# Patient Record
Sex: Male | Born: 1955
Health system: Southern US, Community
[De-identification: ages and names within clinical notes are randomized; demographics above are authoritative.]

## PROBLEM LIST (undated history)

## (undated) DIAGNOSIS — T7840XA Allergy, unspecified, initial encounter: Secondary | ICD-10-CM

## (undated) DIAGNOSIS — I10 Essential (primary) hypertension: Secondary | ICD-10-CM

## (undated) DIAGNOSIS — R011 Cardiac murmur, unspecified: Secondary | ICD-10-CM

## (undated) HISTORY — DX: Cardiac murmur, unspecified: R01.1

## (undated) HISTORY — DX: Essential (primary) hypertension: I10

## (undated) HISTORY — PX: OTHER SURGICAL HISTORY: SHX169

## (undated) HISTORY — DX: Allergy, unspecified, initial encounter: T78.40XA

---

## 2004-09-22 ENCOUNTER — Ambulatory Visit: Payer: Self-pay | Admitting: Family Medicine

## 2004-10-05 ENCOUNTER — Ambulatory Visit: Payer: Self-pay | Admitting: Family Medicine

## 2004-10-18 ENCOUNTER — Ambulatory Visit: Payer: Self-pay | Admitting: Family Medicine

## 2004-10-31 HISTORY — PX: KNEE ARTHROSCOPY WITH ANTERIOR CRUCIATE LIGAMENT (ACL) REPAIR: SHX5644

## 2005-07-19 ENCOUNTER — Ambulatory Visit: Payer: Self-pay | Admitting: Family Medicine

## 2005-08-02 ENCOUNTER — Ambulatory Visit: Payer: Self-pay | Admitting: Family Medicine

## 2005-09-02 ENCOUNTER — Ambulatory Visit: Payer: Self-pay | Admitting: Family Medicine

## 2006-03-23 ENCOUNTER — Ambulatory Visit: Payer: Self-pay | Admitting: Family Medicine

## 2006-03-25 ENCOUNTER — Ambulatory Visit: Payer: Self-pay | Admitting: Family Medicine

## 2006-10-02 ENCOUNTER — Ambulatory Visit: Payer: Self-pay | Admitting: Family Medicine

## 2006-10-02 LAB — CONVERTED CEMR LAB
BUN: 17 mg/dL (ref 6–23)
Basophils Absolute: 0 10*3/uL (ref 0.0–0.1)
Basophils Relative: 0 % (ref 0.0–1.0)
CO2: 30 meq/L (ref 19–32)
Calcium: 9.1 mg/dL (ref 8.4–10.5)
Chloride: 106 meq/L (ref 96–112)
Chol/HDL Ratio, serum: 4
Cholesterol: 200 mg/dL (ref 0–200)
Creatinine, Ser: 1.1 mg/dL (ref 0.4–1.5)
Glomerular Filtration Rate, Af Am: 91 mL/min/{1.73_m2}
Glucose, Bld: 98 mg/dL (ref 70–99)
HDL: 50.4 mg/dL (ref >39.0)
Hemoglobin: 14.3 g/dL (ref 13.0–17.0)
LDL Cholesterol: 136 mg/dL — ABNORMAL HIGH (ref 0–99)
Lymphocytes Relative: 47.5 % — ABNORMAL HIGH (ref 12.0–46.0)
MCV: 92.5 fL (ref 78.0–100.0)
Monocytes Absolute: 0.3 10*3/uL (ref 0.2–0.7)
Monocytes Relative: 7.5 % (ref 3.0–11.0)
Neutrophils Relative %: 43.3 % (ref 43.0–77.0)
Platelets: 224 10*3/uL (ref 150–400)
Potassium: 4.5 meq/L (ref 3.5–5.1)
TSH: 2.3 microintl units/mL (ref 0.35–5.50)
Total Protein: 6.6 g/dL (ref 6.0–8.3)
Triglyceride fasting, serum: 68 mg/dL (ref 0–149)

## 2006-10-10 ENCOUNTER — Ambulatory Visit: Payer: Self-pay | Admitting: Family Medicine

## 2006-10-31 HISTORY — PX: COLONOSCOPY: SHX174

## 2006-12-12 ENCOUNTER — Ambulatory Visit: Payer: Self-pay | Admitting: Family Medicine

## 2007-03-09 ENCOUNTER — Ambulatory Visit: Payer: Self-pay | Admitting: Gastroenterology

## 2007-03-23 ENCOUNTER — Ambulatory Visit: Payer: Self-pay | Admitting: Gastroenterology

## 2007-05-31 DIAGNOSIS — T6391XA Toxic effect of contact with unspecified venomous animal, accidental (unintentional), initial encounter: Secondary | ICD-10-CM | POA: Insufficient documentation

## 2007-06-01 ENCOUNTER — Ambulatory Visit: Payer: Self-pay | Admitting: Family Medicine

## 2008-08-22 ENCOUNTER — Ambulatory Visit: Payer: Self-pay | Admitting: Family Medicine

## 2008-08-22 LAB — CONVERTED CEMR LAB
ALT: 30 units/L (ref 0–53)
AST: 31 units/L (ref 0–37)
Albumin: 4 g/dL (ref 3.5–5.2)
Alkaline Phosphatase: 76 units/L (ref 39–117)
BUN: 17 mg/dL (ref 6–23)
Basophils Absolute: 0 10*3/uL (ref 0.0–0.1)
Basophils Relative: 0.4 % (ref 0.0–3.0)
Bilirubin Urine: NEGATIVE
Bilirubin, Direct: 0.1 mg/dL (ref 0.0–0.3)
Blood in Urine, dipstick: NEGATIVE
CO2: 32 meq/L (ref 19–32)
Calcium: 9.2 mg/dL (ref 8.4–10.5)
Chloride: 106 meq/L (ref 96–112)
Cholesterol: 212 mg/dL (ref 0–200)
Creatinine, Ser: 1 mg/dL (ref 0.4–1.5)
Direct LDL: 130.7 mg/dL
Eosinophils Absolute: 0.1 10*3/uL (ref 0.0–0.7)
Eosinophils Relative: 2.3 % (ref 0.0–5.0)
GFR calc Af Amer: 101 mL/min
GFR calc non Af Amer: 83 mL/min
Glucose, Bld: 100 mg/dL — ABNORMAL HIGH (ref 70–99)
Glucose, Urine, Semiquant: NEGATIVE
HCT: 41.8 % (ref 39.0–52.0)
HDL: 50.8 mg/dL (ref >39.0)
Hemoglobin: 14.1 g/dL (ref 13.0–17.0)
Lymphocytes Relative: 43.4 % (ref 12.0–46.0)
MCHC: 33.7 g/dL (ref 30.0–36.0)
MCV: 93.3 fL (ref 78.0–100.0)
Monocytes Absolute: 0.4 10*3/uL (ref 0.1–1.0)
Monocytes Relative: 6.9 % (ref 3.0–12.0)
Neutro Abs: 2.4 10*3/uL (ref 1.4–7.7)
Neutrophils Relative %: 47 % (ref 43.0–77.0)
PSA: 0.41 ng/mL (ref 0.10–4.00)
Platelets: 198 10*3/uL (ref 150–400)
Potassium: 4.9 meq/L (ref 3.5–5.1)
Protein, U semiquant: NEGATIVE
RBC: 4.48 M/uL (ref 4.22–5.81)
RDW: 11.3 % — ABNORMAL LOW (ref 11.5–14.6)
Sodium: 143 meq/L (ref 135–145)
TSH: 1.29 microintl units/mL (ref 0.35–5.50)
Total Bilirubin: 0.8 mg/dL (ref 0.3–1.2)
Total CHOL/HDL Ratio: 4.2
Total Protein: 6.9 g/dL (ref 6.0–8.3)
Triglycerides: 72 mg/dL (ref 0–149)
Urobilinogen, UA: 0.2
VLDL: 14 mg/dL (ref 0–40)
WBC: 5.1 10*3/uL (ref 4.5–10.5)
pH: 8

## 2008-08-29 ENCOUNTER — Ambulatory Visit: Payer: Self-pay | Admitting: Family Medicine

## 2009-07-21 ENCOUNTER — Ambulatory Visit: Payer: Self-pay | Admitting: Family Medicine

## 2009-07-21 ENCOUNTER — Encounter: Payer: Self-pay | Admitting: Family Medicine

## 2009-07-21 DIAGNOSIS — L301 Dyshidrosis [pompholyx]: Secondary | ICD-10-CM

## 2009-07-23 DIAGNOSIS — D239 Other benign neoplasm of skin, unspecified: Secondary | ICD-10-CM | POA: Insufficient documentation

## 2009-12-24 ENCOUNTER — Ambulatory Visit: Payer: Self-pay | Admitting: Internal Medicine

## 2009-12-24 DIAGNOSIS — K120 Recurrent oral aphthae: Secondary | ICD-10-CM | POA: Insufficient documentation

## 2009-12-25 ENCOUNTER — Ambulatory Visit: Payer: Self-pay | Admitting: Family Medicine

## 2009-12-25 LAB — CONVERTED CEMR LAB
Alkaline Phosphatase: 80 units/L (ref 39–117)
Basophils Absolute: 0 10*3/uL (ref 0.0–0.1)
Bilirubin Urine: NEGATIVE
Bilirubin, Direct: 0 mg/dL (ref 0.0–0.3)
Blood in Urine, dipstick: NEGATIVE
CO2: 31 meq/L (ref 19–32)
Calcium: 9.3 mg/dL (ref 8.4–10.5)
Cholesterol: 202 mg/dL — ABNORMAL HIGH (ref 0–200)
Creatinine, Ser: 0.9 mg/dL (ref 0.4–1.5)
Direct LDL: 135.8 mg/dL
Eosinophils Absolute: 0.1 10*3/uL (ref 0.0–0.7)
Glucose, Bld: 97 mg/dL (ref 70–99)
HDL: 56.8 mg/dL (ref >39.00)
Ketones, urine, test strip: NEGATIVE
Lymphocytes Relative: 42.2 % (ref 12.0–46.0)
MCHC: 33.2 g/dL (ref 30.0–36.0)
Neutrophils Relative %: 45.7 % (ref 43.0–77.0)
RDW: 11.4 % — ABNORMAL LOW (ref 11.5–14.6)
Total Bilirubin: 0.5 mg/dL (ref 0.3–1.2)
Total CHOL/HDL Ratio: 4
Triglycerides: 103 mg/dL (ref 0.0–149.0)
Urobilinogen, UA: 0.2

## 2010-01-01 ENCOUNTER — Ambulatory Visit: Payer: Self-pay | Admitting: Family Medicine

## 2010-11-30 NOTE — Assessment & Plan Note (Signed)
Summary: CPX/RCD   Vital Signs:  Patient profile:   55 year old male Height:      71.75 inches Weight:      184 pounds Temp:     97.2 degrees F oral BP sitting:   160 / 90  (left arm) Cuff size:   regular  Vitals Entered By: Kern Reap CMA Duncan Dull) (January 01, 2010 10:27 AM)  Reason for Visit cpx  History of Present Illness: Daniel Harding is a 55 year old single male, nonsmoker, who comes in today for physical evaluation.  He's always been in excellent, health.  He's had no chronic health problems.  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 p.r.n.  He also takes an 81-mg baby aspirin daily.  Health maintenance.  Activities, he gets routine eye care.  Dental care.  Colonoscopy in GI 2008 normal tetanus 2004.  BP at home, normal BP today 160/90.  Review of systems negative except he would like a complete skin exam because he has a lot of freckles and he gets a lot of sunlight.  He plays a lot of golf  Allergies: No Known Drug Allergies  Past History:  Past medical, surgical, family and social histories (including risk factors) reviewed, and no changes noted (except as noted below).  Past Medical History: Reviewed history from 08/29/2008 and no changes required. Unremarkable  Past Surgical History: Reviewed history from 08/29/2008 and no changes required. septal deviation  Family History: Reviewed history from 08/29/2008 and no changes required.  father has prostate cancermother is a history of glaucoma and hypertension no brothers two sisters, one with glaucoma  Social History: Reviewed history from 08/29/2008 and no changes required. Occupation: Never Smoked Alcohol use-no Drug use-no Regular exercise-yes Divorced  Review of Systems      See HPI  Physical Exam  General:  Well-developed,well-nourished,in no acute distress; alert,appropriate and cooperative throughout examination Head:  Normocephalic and atraumatic  without obvious abnormalities. No apparent alopecia or balding. Eyes:  No corneal or conjunctival inflammation noted. EOMI. Perrla. Funduscopic exam benign, without hemorrhages, exudates or papilledema. Vision grossly normal. Ears:  External ear exam shows no significant lesions or deformities.  Otoscopic examination reveals clear canals, tympanic membranes are intact bilaterally without bulging, retraction, inflammation or discharge. Hearing is grossly normal bilaterally. Nose:  External nasal examination shows no deformity or inflammation. Nasal mucosa are pink and moist without lesions or exudates. Mouth:  Oral mucosa and oropharynx without lesions or exudates.  Teeth in good repair. Neck:  No deformities, masses, or tenderness noted. Chest Wall:  No deformities, masses, tenderness or gynecomastia noted. Breasts:  No masses or gynecomastia noted Lungs:  Normal respiratory effort, chest expands symmetrically. Lungs are clear to auscultation, no crackles or wheezes. Heart:  Normal rate and regular rhythm. S1 and S2 normal without gallop, murmur, click, rub or other extra sounds. Abdomen:  Bowel sounds positive,abdomen soft and non-tender without masses, organomegaly or hernias noted. Rectal:  No external abnormalities noted. Normal sphincter tone. No rectal masses or tenderness. Genitalia:  Testes bilaterally descended without nodularity, tenderness or masses. No scrotal masses or lesions. No penis lesions or urethral discharge. Prostate:  Prostate gland firm and smooth, no enlargement, nodularity, tenderness, mass, asymmetry or induration. Msk:  No deformity or scoliosis noted of thoracic or lumbar spine.   Pulses:  R and L carotid,radial,femoral,dorsalis pedis and posterior tibial pulses are full and equal bilaterally Extremities:  No clubbing, cyanosis, edema, or deformity noted with normal  full range of motion of all joints.   Neurologic:  No cranial nerve deficits noted. Station and gait are  normal. Plantar reflexes are down-going bilaterally. DTRs are symmetrical throughout. Sensory, motor and coordinative functions appear intact.   Impression & Recommendations:  Problem # 1:  DYSHIDROTIC ECZEMA (ICD-705.81) Assessment Improved  Orders: Prescription Created Electronically [id number])  Problem # 2:  HEALTH MAINTENANCE EXAM (ICD-V70.0) Assessment: Unchanged  Orders: Prescription Created Electronically [id number]) EKG w/ Interpretation (93000)  Complete Medication List: 1)  Aspirin 81 Mg Tbec (Aspirin) .... Once daily 2)  Multivitamins Tabs (Multiple vitamin) .... Once daily 3)  Amcinonide 0.1 % Oint (Amcinonide) .... Apply two times a day  Patient Instructions: 1)  Please schedule a follow-up appointment in 1 year. 2)  It is important that you exercise regularly at least 20 minutes 5 times a week. If you develop chest pain, have severe difficulty breathing, or feel very tired , stop exercising immediately and seek medical attention. 3)  Take an Aspirin every day. 4)  be sure to wear a hat, and SPS 50+ sunscreen and play   more golf..................also in your your time with your ICU nurse friend.............. nurses are awesome!!!!!!!!!!!!!!! Prescriptions: AMCINONIDE 0.1 % OINT (AMCINONIDE) apply two times a day  #60 gr. x 3   Entered by:   Kern Reap CMA (AAMA)   Authorized by:   Roderick Pee MD   Signed by:   Kern Reap CMA (AAMA) on 01/01/2010   Method used:   Electronically to        [facility]  [place] [address] (retail)       [address]       [place], [place]  [address]       Ph: [phone] or [phone]       Fax: [phone]   RxID:   [id number]

## 2010-11-30 NOTE — Assessment & Plan Note (Signed)
Summary: sores on lips/njr   Vital Signs:  Patient profile:   55 year old male Weight:      190 pounds Temp:     97.6 degrees F oral BP sitting:   128 / 80  (left arm)  Vitals Entered By: Duard Brady LPN (December 24, 2009 2:22 PM) CC: c/o sores on lips x1wk - lymph nodes swollen , no fevers , no hx of fever blisters Is Patient Diabetic? No   CC:  c/o sores on lips x1wk - lymph nodes swollen , no fevers , and no hx of fever blisters.  History of Present Illness: 55 year old patient who has enjoyed excellent health.  For the past 7 days.  He has experienced painful small ulcers on his right lower lip associated with some mild cervical adenopathy.  Malaise, and a sense of a low grade fever.  Ulcers are much improved.  He has had no prior episodes of oral or genital ulcers.  No pertinent exposure history.  Preventive Screening-Counseling & Management  Alcohol-Tobacco     Smoking Status: quit  Allergies (verified): No Known Drug Allergies  Past History:  Past Medical History: Reviewed history from 08/29/2008 and no changes required. Unremarkable  Social History: Smoking Status:  quit  Review of Systems       The patient complains of suspicious skin lesions.  The patient denies anorexia, fever, weight loss, weight gain, vision loss, decreased hearing, hoarseness, chest pain, syncope, dyspnea on exertion, peripheral edema, prolonged cough, headaches, hemoptysis, abdominal pain, melena, hematochezia, hematuria, incontinence, genital sores, muscle weakness, transient blindness, difficulty walking, depression, unusual weight change, abnormal bleeding, enlarged lymph nodes, angioedema, breast masses, and testicular masses.    Physical Exam  General:  Well-developed,well-nourished,in no acute distress; alert,appropriate and cooperative throughout examination Mouth:  Oral mucosa and oropharynx without lesions or exudates.  Teeth in good repair. 3 small two to 3-mm crusted  lesions noted involving the right lower lip Neck:  mild tender adenopathy.  Anterior cervical region Lungs:  Normal respiratory effort, chest expands symmetrically. Lungs are clear to auscultation, no crackles or wheezes. Heart:  Normal rate and regular rhythm. S1 and S2 normal without gallop, murmur, click, rub or other extra sounds.   Impression & Recommendations:  Problem # 1:  APHTHOUS ULCERS (ICD-528.2) local skin care discussed  Complete Medication List: 1)  Aspirin 81 Mg Tbec (Aspirin) .... Once daily 2)  Multivitamins Tabs (Multiple vitamin) .... Once daily 3)  Amcinonide 0.1 % Oint (Amcinonide) .... Apply two times a day  Patient Instructions: 1)  Please schedule a follow-up appointment as needed. 2)  annual exam as scheduled

## 2011-03-17 ENCOUNTER — Other Ambulatory Visit (INDEPENDENT_AMBULATORY_CARE_PROVIDER_SITE_OTHER): Payer: BC Managed Care – PPO

## 2011-03-17 DIAGNOSIS — Z0000 Encounter for general adult medical examination without abnormal findings: Secondary | ICD-10-CM

## 2011-03-17 LAB — LIPID PANEL
HDL: 50.1 mg/dL (ref >39.00)
Total CHOL/HDL Ratio: 4
VLDL: 14.4 mg/dL (ref 0.0–40.0)

## 2011-03-17 LAB — BASIC METABOLIC PANEL
BUN: 20 mg/dL (ref 6–23)
Chloride: 106 mEq/L (ref 96–112)
Creatinine, Ser: 1 mg/dL (ref 0.4–1.5)
GFR: 86 mL/min (ref >60.00)
Glucose, Bld: 83 mg/dL (ref 70–99)
Sodium: 142 mEq/L (ref 135–145)

## 2011-03-17 LAB — CBC WITH DIFFERENTIAL/PLATELET
Basophils Absolute: 0 10*3/uL (ref 0.0–0.1)
Eosinophils Relative: 3.4 % (ref 0.0–5.0)
HCT: 40.9 % (ref 39.0–52.0)
Lymphs Abs: 2.4 10*3/uL (ref 0.7–4.0)
Monocytes Absolute: 0.3 10*3/uL (ref 0.1–1.0)
Monocytes Relative: 6.5 % (ref 3.0–12.0)
Neutrophils Relative %: 45.3 % (ref 43.0–77.0)
Platelets: 220 10*3/uL (ref 150.0–400.0)
RDW: 12.8 % (ref 11.5–14.6)
WBC: 5.3 10*3/uL (ref 4.5–10.5)

## 2011-03-17 LAB — HEPATIC FUNCTION PANEL
AST: 25 U/L (ref 0–37)
Bilirubin, Direct: 0 mg/dL (ref 0.0–0.3)
Total Bilirubin: 0.5 mg/dL (ref 0.3–1.2)

## 2011-03-17 LAB — POCT URINALYSIS DIPSTICK
Leukocytes, UA: NEGATIVE
Nitrite, UA: NEGATIVE
Protein, UA: NEGATIVE
Urobilinogen, UA: 0.2
pH, UA: 7

## 2011-03-24 ENCOUNTER — Encounter: Payer: Self-pay | Admitting: Family Medicine

## 2011-03-24 ENCOUNTER — Ambulatory Visit (INDEPENDENT_AMBULATORY_CARE_PROVIDER_SITE_OTHER): Payer: BC Managed Care – PPO | Admitting: Family Medicine

## 2011-03-24 DIAGNOSIS — D239 Other benign neoplasm of skin, unspecified: Secondary | ICD-10-CM

## 2011-03-24 DIAGNOSIS — L309 Dermatitis, unspecified: Secondary | ICD-10-CM

## 2011-03-24 DIAGNOSIS — L259 Unspecified contact dermatitis, unspecified cause: Secondary | ICD-10-CM

## 2011-03-24 MED ORDER — DESOXIMETASONE 0.05 % EX GEL: 1.0000 g | Freq: Two times a day (BID) | CUTANEOUS | Status: DC

## 2011-03-24 NOTE — Patient Instructions (Signed)
Return sometime in the next couple weeks for removal of the lesion on the left shoulder.  Apply small amount the steroid gel twice daily to the rash.  Return for yearly exam sooner if any problems

## 2011-03-24 NOTE — Progress Notes (Signed)
  Subjective:    Patient ID: Daniel Harding, male    DOB: May 13, 1956, 55 y.o.   MRN: [id number]  HPIBob is a 55 year old male, nonsmoker, who comes in today for general physical examination  Its always been in excellent health.  Has had no chronic health problems and he takes no medication on a regular basis except for an 81-mg baby aspirin daily.  Review of systems otherwise negative except for the last 8 months.  He said sleep dysfunction.  He describes it as the inability to maintain sleep.  He goes to bed to sleep right away, but wakes up at 3 o'clock in the morning.  Can't go back to sleep.  He's tried over-the-counter Benadryl and that seems to help.  His caffeine consumption is minimal.  We discussed for his options.  He is content to continue the Benadryl.  I recommend yearly eye exams because this mother and sister have had a history of glaucoma.  Referred to Dr. Vonna Kotyk.  He gets regular dental care, colonoscopy, normal, tetanus, 2004,    Review of Systems  Psychiatric/Behavioral: Positive for sleep disturbance.       Objective:   Physical Exam  Constitutional: He is oriented to person, place, and time. He appears well-developed and well-nourished.  HENT:  Head: Normocephalic and atraumatic.  Right Ear: External ear normal.  Left Ear: External ear normal.  Nose: Nose normal.  Mouth/Throat: Oropharynx is clear and moist.  Eyes: Conjunctivae and EOM are normal. Pupils are equal, round, and reactive to light.  Neck: Normal range of motion. Neck supple. No JVD present. No tracheal deviation present. No thyromegaly present.  Cardiovascular: Normal rate, regular rhythm, normal heart sounds and intact distal pulses.  Exam reveals no gallop and no friction rub.   No murmur heard. Pulmonary/Chest: Effort normal and breath sounds normal. No stridor. No respiratory distress. He has no wheezes. He has no rales. He exhibits no tenderness.  Abdominal: Soft. Bowel sounds are normal. He  exhibits no distension and no mass. There is no tenderness. There is no rebound and no guarding.  Genitourinary: Rectum normal, prostate normal and penis normal. Guaiac negative stool. No penile tenderness.  Musculoskeletal: Normal range of motion. He exhibits no edema and no tenderness.  Lymphadenopathy:    He has no cervical adenopathy.  Neurological: He is alert and oriented to person, place, and time. He has normal reflexes. No cranial nerve deficit. He exhibits normal muscle tone.  Skin: Skin is warm and dry. No rash noted. No erythema. No pallor.       Total body skin exam normal except for an excoriated lesion left shoulder advised to return for removal in a.  Irritated lesion between his buttocks consistent with a contact type dermatitis  Psychiatric: He has a normal mood and affect. His behavior is normal. Judgment and thought content normal.          Assessment & Plan:  Healthy male.  Abnormal lesion, left shoulder.  Return for removal.  Irritant dermatitis,,,,,,,,,, Topicort gel b.i.d.

## 2011-03-31 ENCOUNTER — Encounter: Payer: Self-pay | Admitting: Family Medicine

## 2011-03-31 ENCOUNTER — Ambulatory Visit (INDEPENDENT_AMBULATORY_CARE_PROVIDER_SITE_OTHER): Payer: BC Managed Care – PPO | Admitting: Family Medicine

## 2011-03-31 DIAGNOSIS — L989 Disorder of the skin and subcutaneous tissue, unspecified: Secondary | ICD-10-CM

## 2011-03-31 DIAGNOSIS — L820 Inflamed seborrheic keratosis: Secondary | ICD-10-CM

## 2011-03-31 NOTE — Progress Notes (Signed)
  Subjective:    Patient ID: Daniel Harding, male    DOB: 1956/08/16, 55 y.o.   MRN: [id number]  HPIRobert is a 55 year old male, who comes in today for removal of a lesion on his left upper shoulder.  He has a 10 mm x 10 mm lesion left upper, shoulder it's crusty raised with a red ring around it.  After informed consent, the area was anesthetized with lidocaine and epinephrine.  The lesion was excised with 2-mm margins and sent for analysis.  The base was cauterized bandage was applied.  The patient left the office in good condition with no complications    Review of Systems    Negative Objective:   Physical Exam    Procedure see above    Assessment & Plan:  Inflamed Sk,,,,,,,,,,,,,,, removed

## 2011-03-31 NOTE — Patient Instructions (Signed)
We will call you the report within two weeks.  If we do not call me

## 2011-04-07 NOTE — Progress Notes (Signed)
Quick Note:  Pt informed on home VM ______ 

## 2013-08-13 ENCOUNTER — Other Ambulatory Visit (INDEPENDENT_AMBULATORY_CARE_PROVIDER_SITE_OTHER): Payer: BC Managed Care – PPO

## 2013-08-13 DIAGNOSIS — Z0000 Encounter for general adult medical examination without abnormal findings: Secondary | ICD-10-CM

## 2013-08-13 LAB — CBC WITH DIFFERENTIAL/PLATELET
Basophils Absolute: 0 10*3/uL (ref 0.0–0.1)
Eosinophils Absolute: 0.1 10*3/uL (ref 0.0–0.7)
Lymphocytes Relative: 40.7 % (ref 12.0–46.0)
Lymphs Abs: 2 10*3/uL (ref 0.7–4.0)
Monocytes Relative: 6.5 % (ref 3.0–12.0)
Platelets: 206 10*3/uL (ref 150.0–400.0)
RDW: 12.6 % (ref 11.5–14.6)

## 2013-08-13 LAB — BASIC METABOLIC PANEL
BUN: 20 mg/dL (ref 6–23)
Calcium: 9.6 mg/dL (ref 8.4–10.5)
GFR: 80.42 mL/min (ref >60.00)
Glucose, Bld: 100 mg/dL — ABNORMAL HIGH (ref 70–99)

## 2013-08-13 LAB — POCT URINALYSIS DIPSTICK
Blood, UA: NEGATIVE
Glucose, UA: NEGATIVE
Nitrite, UA: NEGATIVE
Protein, UA: NEGATIVE
Spec Grav, UA: 1.01
Urobilinogen, UA: 0.2
pH, UA: 7

## 2013-08-13 LAB — HEPATIC FUNCTION PANEL
ALT: 21 U/L (ref 0–53)
AST: 27 U/L (ref 0–37)
Albumin: 4.1 g/dL (ref 3.5–5.2)
Total Protein: 7.1 g/dL (ref 6.0–8.3)

## 2013-08-13 LAB — LIPID PANEL
Total CHOL/HDL Ratio: 5
VLDL: 19.2 mg/dL (ref 0.0–40.0)

## 2013-08-13 LAB — TSH: TSH: 1.82 u[IU]/mL (ref 0.35–5.50)

## 2013-08-13 LAB — LDL CHOLESTEROL, DIRECT: Direct LDL: 172.5 mg/dL

## 2013-08-20 ENCOUNTER — Ambulatory Visit (INDEPENDENT_AMBULATORY_CARE_PROVIDER_SITE_OTHER): Payer: BC Managed Care – PPO | Admitting: Family Medicine

## 2013-08-20 ENCOUNTER — Encounter: Payer: Self-pay | Admitting: Family Medicine

## 2013-08-20 VITALS — BP 160/90 | Temp 97.6°F | Ht 73.0 in | Wt 197.0 lb

## 2013-08-20 DIAGNOSIS — Z23 Encounter for immunization: Secondary | ICD-10-CM

## 2013-08-20 DIAGNOSIS — G8929 Other chronic pain: Secondary | ICD-10-CM

## 2013-08-20 DIAGNOSIS — M25519 Pain in unspecified shoulder: Secondary | ICD-10-CM

## 2013-08-20 DIAGNOSIS — R030 Elevated blood-pressure reading, without diagnosis of hypertension: Secondary | ICD-10-CM

## 2013-08-20 MED ORDER — TRAMADOL HCL 50 MG PO TABS: ORAL_TABLET | ORAL | Status: DC

## 2013-08-20 NOTE — Addendum Note (Signed)
Addended by: Kern Reap B on: 08/20/2013 05:26 PM   Modules accepted: Orders

## 2013-08-20 NOTE — Patient Instructions (Signed)
Stop the Mobic  Only take Tylenol........... 2 tabs 3 times daily  , tramadol 50 mg,,,,,,,,,, one half to one tablet at bedtime nightly for shoulder pain  Check your blood pressure daily in the morning  Return in 4 weeks for followup with all the data and the device  If you need a new blood pressure cuff the most accurate I have seen is the   Omron digital pump up blood pressure cuff,,,,,,,,,,,, [employer]

## 2013-08-20 NOTE — Progress Notes (Signed)
  Subjective:    Patient ID: Daniel Harding, male    DOB: [birth year], 57 y.o.   MRN: [id number]  HPI Daniel Harding is a 57 year old married male nonsmoker who comes in today for general physical examination  About 2 months ago he was diagnosed with a frozen shoulder. He's been undergoing home physical therapy 3 times a day. He had one shot of cortisone that didn't help. He's taken Mobic 15 mg daily  His blood pressure today was 160/90. In the past his blood pressures been elevated but on home blood pressure monitoring it's dropped back to normal.  His father had hypertension   Review of Systems  Constitutional: Negative.   HENT: Negative.   Eyes: Negative.   Respiratory: Negative.   Cardiovascular: Negative.   Gastrointestinal: Negative.   Genitourinary: Negative.   Musculoskeletal: Negative.   Skin: Negative.   Neurological: Negative.   Psychiatric/Behavioral: Negative.   All other systems reviewed and are negative.       Objective:   Physical Exam  Constitutional: He is oriented to person, place, and time. He appears well-developed and well-nourished.  HENT:  Head: Normocephalic and atraumatic.  Right Ear: External ear normal.  Left Ear: External ear normal.  Nose: Nose normal.  Mouth/Throat: Oropharynx is clear and moist.  Eyes: Conjunctivae and EOM are normal. Pupils are equal, round, and reactive to light.  Neck: Normal range of motion. Neck supple. No JVD present. No tracheal deviation present. No thyromegaly present.  Cardiovascular: Normal rate, regular rhythm, normal heart sounds and intact distal pulses.  Exam reveals no gallop and no friction rub.   No murmur heard. No carotid or urinary bruits peripheral pulses 2+ and symmetrical  Unable to do blood pressure in right arm because he can straighten it out because of his frozen shoulder.  Blood pressure left arm 160/90 pulse 70 and regular  Pulmonary/Chest: Effort normal and breath sounds normal. No stridor. No  respiratory distress. He has no wheezes. He has no rales. He exhibits no tenderness.  Abdominal: Soft. Bowel sounds are normal. He exhibits no distension and no mass. There is no tenderness. There is no rebound and no guarding.  Genitourinary: Rectum normal, prostate normal and penis normal. Guaiac negative stool. No penile tenderness.  Musculoskeletal: Normal range of motion. He exhibits no edema and no tenderness.  Lymphadenopathy:    He has no cervical adenopathy.  Neurological: He is alert and oriented to person, place, and time. He has normal reflexes. No cranial nerve deficit. He exhibits normal muscle tone.  Skin: Skin is warm and dry. No rash noted. No erythema. No pallor.  Psychiatric: He has a normal mood and affect. His behavior is normal. Judgment and thought content normal.          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

## 2013-09-13 ENCOUNTER — Encounter: Payer: Self-pay | Admitting: *Deleted

## 2013-09-16 ENCOUNTER — Encounter: Payer: Self-pay | Admitting: Family Medicine

## 2013-09-16 ENCOUNTER — Ambulatory Visit (INDEPENDENT_AMBULATORY_CARE_PROVIDER_SITE_OTHER): Payer: BC Managed Care – PPO | Admitting: Family Medicine

## 2013-09-16 VITALS — BP 160/98 | Temp 97.9°F | Wt 198.0 lb

## 2013-09-16 DIAGNOSIS — R030 Elevated blood-pressure reading, without diagnosis of hypertension: Secondary | ICD-10-CM

## 2013-09-16 DIAGNOSIS — I10 Essential (primary) hypertension: Secondary | ICD-10-CM | POA: Insufficient documentation

## 2013-09-16 MED ORDER — LISINOPRIL-HYDROCHLOROTHIAZIDE 10-12.5 MG PO TABS: 1.0000 | ORAL_TABLET | Freq: Every day | ORAL | Status: DC

## 2013-09-16 NOTE — Patient Instructions (Addendum)
Begin the Zestoretic one tablet daily  Check your blood pressure daily in the morning  Return in 2 weeks for followup  The 2 most common side effect from this medication is hives........... if you develop this complication stop the medicine immediately and call me......... and a cough

## 2013-09-16 NOTE — Progress Notes (Signed)
  Subjective:    Patient ID: Daniel Harding, male    DOB: 05-28-1956, 57 y.o.   MRN: [id number]  HPI Daniel Harding is a 57 year old male with a family history of hypertension,,,,,,, his dad,,,,,, who comes in today for followup of elevated blood pressures  He's been monitoring his blood pressure at home are averaging 145/90.  He is not taking any over-the-counter medication. He stopped the Mobic and did not get the tramadol prescription filled for his frozen shoulder pain   Review of Systems Review of systems negative,,,,,, followed by orthopedics with a frozen shoulder    Objective:   Physical Exam  Well-developed well-nourished male no acute distress BP right arm sitting position with his cuff hours are about the same 160/98, 160/92 his machine      Assessment & Plan:  Hypertension start ACE inhibitor followup in 3 weeks

## 2013-09-16 NOTE — Progress Notes (Signed)
Pre visit review using our clinic review tool, if applicable. No additional management support is needed unless otherwise documented below in the visit note. 

## 2013-10-17 ENCOUNTER — Ambulatory Visit (INDEPENDENT_AMBULATORY_CARE_PROVIDER_SITE_OTHER): Payer: BC Managed Care – PPO | Admitting: Family Medicine

## 2013-10-17 ENCOUNTER — Encounter: Payer: Self-pay | Admitting: Family Medicine

## 2013-10-17 VITALS — BP 150/90 | Temp 97.6°F | Wt 196.0 lb

## 2013-10-17 DIAGNOSIS — I10 Essential (primary) hypertension: Secondary | ICD-10-CM

## 2013-10-17 NOTE — Progress Notes (Signed)
   Subjective:    Patient ID: Daniel Harding, male    DOB: [birth year], 57 y.o.   MRN: [id number]  HPI Daniel Harding is a 57 year old male nonsmoker who comes in today for followup of hypertension. He's on lisinopril 10-12.5 daily BP at home averaging 130/80. BP here today 150/90.   Review of Systems    review of systems negative Objective:   Physical Exam  Well-developed and nourished male no acute distress BP right arm sitting position 150/90 however BP at home normal      Assessment & Plan:  Normotensive with blood pressures at home continue current therapy

## 2013-10-17 NOTE — Patient Instructions (Signed)
Continue current medication  Followup when you're due for your annual physical

## 2014-08-27 ENCOUNTER — Ambulatory Visit (INDEPENDENT_AMBULATORY_CARE_PROVIDER_SITE_OTHER): Payer: BC Managed Care – PPO | Admitting: Family Medicine

## 2014-08-27 ENCOUNTER — Ambulatory Visit (INDEPENDENT_AMBULATORY_CARE_PROVIDER_SITE_OTHER)
Admission: RE | Admit: 2014-08-27 | Discharge: 2014-08-27 | Disposition: A | Discharge status: Home / Self Care | Payer: BC Managed Care – PPO | Source: Ambulatory Visit | Attending: Family Medicine | Admitting: Family Medicine

## 2014-08-27 ENCOUNTER — Encounter: Payer: Self-pay | Admitting: Family Medicine

## 2014-08-27 VITALS — BP 140/80 | Temp 97.2°F | Wt 189.0 lb

## 2014-08-27 DIAGNOSIS — R200 Anesthesia of skin: Secondary | ICD-10-CM | POA: Insufficient documentation

## 2014-08-27 DIAGNOSIS — M5090 Cervical disc disorder, unspecified, unspecified cervical region: Secondary | ICD-10-CM | POA: Insufficient documentation

## 2014-08-27 DIAGNOSIS — R208 Other disturbances of skin sensation: Secondary | ICD-10-CM

## 2014-08-27 MED ORDER — CYCLOBENZAPRINE HCL 5 MG PO TABS: 5.0000 mg | ORAL_TABLET | Freq: Three times a day (TID) | ORAL | Status: DC | PRN

## 2014-08-27 MED ORDER — TRAMADOL HCL 50 MG PO TABS: ORAL_TABLET | ORAL | Status: DC

## 2014-08-27 NOTE — Patient Instructions (Signed)
Go to the main office now for your x-rays  Motrin 400 mg twice daily  Flexeril 5 mg and tramadol 50 mg............... one half of each at bedtime when necessary

## 2014-08-27 NOTE — Progress Notes (Signed)
   Subjective:    Patient ID: Daniel Harding, male    DOB: 1956/02/20, 58 y.o.   MRN: [id number]  HPI Daniel Harding is a 58 year old male nonsmoker who comes in today for evaluation of neck pain and numbness in his left hand  He states 2 months ago he was helping somebody move sit up suddenly and hit his head on the top of the [employer] truck. He laid down for about 10 minutes and felt okay in a month later he began having pain in his neck. He describes the pain as left and right sided 8.2 C4-C5 versus source for pain. It does not radiate. He describes a brain pain is constant and burning.  He also noticed some numbness in the fourth and fifth fingers of his left hand also occasionally will stay to have some numbness on his thumb index and middle finger also the left hand. He does wear a watch on his left wrist.  He denies any neurologic symptoms   Review of Systems Review of systems otherwise negative no history of trauma    Objective:   Physical Exam  Well-developed well-nourished male no acute distress vital signs stable he is afebrile examination of the head was normal examination neck shows no palpable tenderness full range of motion. Upper extremities is normal sensation strength and reflexes.      Assessment & Plan:  Cervical neck pain with numbness of his left fourth and fifth fingers.......... x-ray begin workup

## 2014-08-27 NOTE — Progress Notes (Signed)
Pre visit review using our clinic review tool, if applicable. No additional management support is needed unless otherwise documented below in the visit note. 

## 2014-09-29 ENCOUNTER — Encounter: Payer: Self-pay | Admitting: Family Medicine

## 2014-09-29 ENCOUNTER — Ambulatory Visit (INDEPENDENT_AMBULATORY_CARE_PROVIDER_SITE_OTHER): Payer: BC Managed Care – PPO | Admitting: Family Medicine

## 2014-09-29 VITALS — BP 130/90 | Temp 97.3°F | Wt 188.0 lb

## 2014-09-29 DIAGNOSIS — K219 Gastro-esophageal reflux disease without esophagitis: Secondary | ICD-10-CM

## 2014-09-29 DIAGNOSIS — R0789 Other chest pain: Secondary | ICD-10-CM

## 2014-09-29 DIAGNOSIS — I10 Essential (primary) hypertension: Secondary | ICD-10-CM

## 2014-09-29 NOTE — Progress Notes (Signed)
Pre visit review using our clinic review tool, if applicable. No additional management support is needed unless otherwise documented below in the visit note. 

## 2014-09-29 NOTE — Patient Instructions (Signed)
If you feel lightheaded while your driving stop and pull over  The discomfort in your chest I think is related to reflux esophagitis. The anti-reflux treatment program is as follows. No caffeine...Marland KitchenMarland KitchenMarland Kitchen no peppermint ....... nothing new to drink for 2 hours prior to bedtime .sleep on 2 pillows ...... OTC Prilosec ............. one twice daily for 1 week then 1 daily in the morning for 6 weeks then stop  Return when necessary .....Marland KitchenMarland Kitchen

## 2014-09-29 NOTE — Progress Notes (Signed)
   Subjective:    Patient ID: Daniel Harding, male    DOB: 12-11-1955, 58 y.o.   MRN: [id number]  HPI Daniel Harding is a 58 year old married male nonsmoker who comes in today accompanied by his wife for evaluation 2 problems  He says he has an occasional sensation of fullness in his ears and lightheadedness. It may last 1-3 minutes a couple times per month. The last episode occurred while he was driving. He slowed down the symptoms went away and he kept driving. No spinning sensation. He does have a history of allergic rhinitis  He's also had for the past 2 months intermittent episodes of tightness in his chest. The last episode was a week ago. He felt tightness in chest get up drink some milk and a 1 away. He works out on the elliptical 3 days a week and has no difficulty working out. No shortness of breath no tightness in his chest etc. etc.   Review of Systems    review of systems otherwise negative Objective:   Physical Exam  Well-developed well-nourished male no acute distress vital signs stable he is afebrile HEENT were negative except for right cerumen impaction which was relieved by irrigation  Neck was supple no adenopathy cardiopulmonary exam normal      Assessment & Plan:  Intermittent spells of lightheadedness......Marland Kitchen may be minimally vertigo attacks........ if he has any further symptoms driving advised to pull off and rest  Atypical chest pain...Marland KitchenMarland KitchenMarland Kitchen relieved by milk...Marland KitchenMarland KitchenMarland Kitchen probable reflux esophagitis..... Anti-reflex program return when necessary

## 2014-10-04 ENCOUNTER — Other Ambulatory Visit: Payer: Self-pay | Admitting: Family Medicine

## 2014-11-07 ENCOUNTER — Other Ambulatory Visit: Payer: Self-pay | Admitting: Family Medicine

## 2014-11-28 ENCOUNTER — Telehealth: Payer: Self-pay | Admitting: Family Medicine

## 2014-11-28 MED ORDER — LISINOPRIL-HYDROCHLOROTHIAZIDE 10-12.5 MG PO TABS: 1.0000 | ORAL_TABLET | Freq: Every day | ORAL | Status: DC

## 2014-11-28 NOTE — Telephone Encounter (Signed)
Patient need a re-fill on lisinopril-hydrochlorothiazide (PRINZIDE,ZESTORETIC) 10-12.5 MG per tablet [id number] [place], [place] [address]

## 2014-12-11 ENCOUNTER — Telehealth: Payer: Self-pay | Admitting: Family Medicine

## 2014-12-11 DIAGNOSIS — M542 Cervicalgia: Secondary | ICD-10-CM

## 2014-12-11 NOTE — Telephone Encounter (Signed)
Pt was seen back in oct 2015 for neck pain.. Pt would like a referral to [facility] on  [address] [phone]. Pt has bcbs. Can we refer?

## 2014-12-11 NOTE — Telephone Encounter (Signed)
Referral placed.

## 2015-09-15 ENCOUNTER — Other Ambulatory Visit: Payer: Self-pay | Admitting: Family Medicine

## 2015-09-15 DIAGNOSIS — M5416 Radiculopathy, lumbar region: Secondary | ICD-10-CM

## 2015-09-25 ENCOUNTER — Ambulatory Visit (HOSPITAL_COMMUNITY)
Admission: RE | Admit: 2015-09-25 | Discharge: 2015-09-25 | Disposition: A | Discharge status: Home / Self Care | Payer: 59 | Source: Ambulatory Visit | Attending: Family Medicine | Admitting: Family Medicine

## 2015-09-25 DIAGNOSIS — M5126 Other intervertebral disc displacement, lumbar region: Secondary | ICD-10-CM | POA: Insufficient documentation

## 2015-09-25 DIAGNOSIS — M5417 Radiculopathy, lumbosacral region: Secondary | ICD-10-CM | POA: Diagnosis present

## 2015-09-25 DIAGNOSIS — M5416 Radiculopathy, lumbar region: Secondary | ICD-10-CM

## 2015-09-28 ENCOUNTER — Ambulatory Visit (HOSPITAL_COMMUNITY): Payer: Self-pay

## 2015-11-04 ENCOUNTER — Telehealth: Payer: Self-pay | Admitting: Family Medicine

## 2015-11-04 MED ORDER — LISINOPRIL-HYDROCHLOROTHIAZIDE 10-12.5 MG PO TABS: 1.0000 | ORAL_TABLET | Freq: Every day | ORAL | Status: DC

## 2015-11-04 NOTE — Telephone Encounter (Signed)
Pt request refill of the following:   lisinopril-hydrochlorothiazide (PRINZIDE,ZESTORETIC) 10-12.5 MG per tablet   Pt will now be using the below pharmacy    Phamacy: [facility] mail order

## 2015-12-09 DIAGNOSIS — H43812 Vitreous degeneration, left eye: Secondary | ICD-10-CM | POA: Diagnosis not present

## 2015-12-09 DIAGNOSIS — H4312 Vitreous hemorrhage, left eye: Secondary | ICD-10-CM | POA: Diagnosis not present

## 2015-12-30 ENCOUNTER — Other Ambulatory Visit: Payer: Self-pay | Admitting: Family Medicine

## 2016-04-29 DIAGNOSIS — H43811 Vitreous degeneration, right eye: Secondary | ICD-10-CM | POA: Diagnosis not present

## 2016-04-29 DIAGNOSIS — H11153 Pinguecula, bilateral: Secondary | ICD-10-CM | POA: Diagnosis not present

## 2016-04-29 DIAGNOSIS — H33312 Horseshoe tear of retina without detachment, left eye: Secondary | ICD-10-CM | POA: Diagnosis not present

## 2016-04-29 DIAGNOSIS — H43812 Vitreous degeneration, left eye: Secondary | ICD-10-CM | POA: Diagnosis not present

## 2016-05-18 DIAGNOSIS — H33311 Horseshoe tear of retina without detachment, right eye: Secondary | ICD-10-CM | POA: Diagnosis not present

## 2016-05-18 DIAGNOSIS — H43811 Vitreous degeneration, right eye: Secondary | ICD-10-CM | POA: Diagnosis not present

## 2016-06-06 DIAGNOSIS — H9113 Presbycusis, bilateral: Secondary | ICD-10-CM | POA: Insufficient documentation

## 2016-06-06 DIAGNOSIS — H9313 Tinnitus, bilateral: Secondary | ICD-10-CM | POA: Insufficient documentation

## 2016-06-06 DIAGNOSIS — H903 Sensorineural hearing loss, bilateral: Secondary | ICD-10-CM | POA: Diagnosis not present

## 2016-06-06 DIAGNOSIS — H833X3 Noise effects on inner ear, bilateral: Secondary | ICD-10-CM | POA: Insufficient documentation

## 2016-07-09 ENCOUNTER — Other Ambulatory Visit: Payer: Self-pay | Admitting: Family Medicine

## 2016-07-18 DIAGNOSIS — H9113 Presbycusis, bilateral: Secondary | ICD-10-CM | POA: Diagnosis not present

## 2016-07-18 DIAGNOSIS — H833X3 Noise effects on inner ear, bilateral: Secondary | ICD-10-CM | POA: Diagnosis not present

## 2016-08-10 ENCOUNTER — Other Ambulatory Visit (INDEPENDENT_AMBULATORY_CARE_PROVIDER_SITE_OTHER): Payer: 59

## 2016-08-10 DIAGNOSIS — Z0000 Encounter for general adult medical examination without abnormal findings: Secondary | ICD-10-CM | POA: Diagnosis not present

## 2016-08-10 LAB — BASIC METABOLIC PANEL
BUN: 16 mg/dL (ref 6–23)
CALCIUM: 9.5 mg/dL (ref 8.4–10.5)
CO2: 29 meq/L (ref 19–32)
CREATININE: 0.97 mg/dL (ref 0.40–1.50)
Chloride: 102 mEq/L (ref 96–112)
GFR: 83.41 mL/min (ref >60.00)
GLUCOSE: 103 mg/dL — AB (ref 70–99)
Potassium: 5.5 mEq/L — ABNORMAL HIGH (ref 3.5–5.1)
SODIUM: 138 meq/L (ref 135–145)

## 2016-08-10 LAB — CBC WITH DIFFERENTIAL/PLATELET
BASOS ABS: 0 10*3/uL (ref 0.0–0.1)
Basophils Relative: 0.6 % (ref 0.0–3.0)
Eosinophils Absolute: 0.2 10*3/uL (ref 0.0–0.7)
Eosinophils Relative: 3.3 % (ref 0.0–5.0)
HEMATOCRIT: 42.8 % (ref 39.0–52.0)
HEMOGLOBIN: 14.4 g/dL (ref 13.0–17.0)
LYMPHS ABS: 2.2 10*3/uL (ref 0.7–4.0)
Lymphocytes Relative: 38.4 % (ref 12.0–46.0)
MCHC: 33.6 g/dL (ref 30.0–36.0)
MCV: 91.6 fl (ref 78.0–100.0)
MONO ABS: 0.4 10*3/uL (ref 0.1–1.0)
MONOS PCT: 7.1 % (ref 3.0–12.0)
NEUTROS ABS: 2.9 10*3/uL (ref 1.4–7.7)
Neutrophils Relative %: 50.6 % (ref 43.0–77.0)
PLATELETS: 229 10*3/uL (ref 150.0–400.0)
RBC: 4.67 Mil/uL (ref 4.22–5.81)
RDW: 12.4 % (ref 11.5–15.5)
WBC: 5.8 10*3/uL (ref 4.0–10.5)

## 2016-08-10 LAB — HEPATIC FUNCTION PANEL
ALBUMIN: 4.2 g/dL (ref 3.5–5.2)
ALK PHOS: 89 U/L (ref 39–117)
ALT: 21 U/L (ref 0–53)
AST: 21 U/L (ref 0–37)
Bilirubin, Direct: 0.1 mg/dL (ref 0.0–0.3)
TOTAL PROTEIN: 6.9 g/dL (ref 6.0–8.3)
Total Bilirubin: 0.5 mg/dL (ref 0.2–1.2)

## 2016-08-10 LAB — LIPID PANEL
Cholesterol: 221 mg/dL — ABNORMAL HIGH (ref 0–200)
HDL: 52.4 mg/dL (ref >39.00)
LDL Cholesterol: 146 mg/dL — ABNORMAL HIGH (ref 0–99)
NONHDL: 168.2
Total CHOL/HDL Ratio: 4
Triglycerides: 113 mg/dL (ref 0.0–149.0)
VLDL: 22.6 mg/dL (ref 0.0–40.0)

## 2016-08-10 LAB — POC URINALSYSI DIPSTICK (AUTOMATED)
Bilirubin, UA: NEGATIVE
Glucose, UA: NEGATIVE
KETONES UA: NEGATIVE
Leukocytes, UA: NEGATIVE
Nitrite, UA: NEGATIVE
RBC UA: NEGATIVE
SPEC GRAV UA: 1.015
UROBILINOGEN UA: 0.2
pH, UA: 8

## 2016-08-10 LAB — PSA: PSA: 0.44 ng/mL (ref 0.10–4.00)

## 2016-08-10 LAB — TSH: TSH: 2.59 u[IU]/mL (ref 0.35–4.50)

## 2016-08-15 ENCOUNTER — Encounter: Payer: Self-pay | Admitting: Family Medicine

## 2016-08-15 ENCOUNTER — Ambulatory Visit (INDEPENDENT_AMBULATORY_CARE_PROVIDER_SITE_OTHER): Payer: 59 | Admitting: Family Medicine

## 2016-08-15 VITALS — BP 130/80 | HR 88 | Temp 97.6°F | Ht 72.25 in | Wt 190.3 lb

## 2016-08-15 DIAGNOSIS — Z0000 Encounter for general adult medical examination without abnormal findings: Secondary | ICD-10-CM | POA: Diagnosis not present

## 2016-08-15 DIAGNOSIS — I10 Essential (primary) hypertension: Secondary | ICD-10-CM

## 2016-08-15 DIAGNOSIS — Z23 Encounter for immunization: Secondary | ICD-10-CM

## 2016-08-15 DIAGNOSIS — H9193 Unspecified hearing loss, bilateral: Secondary | ICD-10-CM | POA: Diagnosis not present

## 2016-08-15 MED ORDER — LISINOPRIL-HYDROCHLOROTHIAZIDE 10-12.5 MG PO TABS: 1.0000 | ORAL_TABLET | Freq: Every day | ORAL | 3 refills | Status: DC

## 2016-08-15 NOTE — Progress Notes (Signed)
Pre visit review using our clinic review tool, if applicable. No additional management support is needed unless otherwise documented below in the visit note. 

## 2016-08-15 NOTE — Patient Instructions (Signed)
Continue medication....... one half tablet daily  Add an aspirin tablet  Return in one year sooner if any problems  Check your blood pressure weekly////////////// Omron pump up digital blood pressure cuff

## 2016-08-15 NOTE — Progress Notes (Signed)
Daniel Harding is a 60 year old married male nonsmoker who comes in today for general physical examination because of history of hypertension  He takes Zestoretic 10-12 0.5 one half tablet daily. His blood pressure at home is 130/80. When he came in here was 168/98. He says it happens every time he goes to a physician.  He gets routine eye care.......Marland Kitchen recently had laser surgery because of retinal detachment, regular dental care, colonoscopy 2006 was normal. He says he never got a recall card first 10 year anniversary colonoscopy. I asked him to call GI and I gave him the phone number.  Vaccination history tetanus 2014 he'll call by the shingles. Flu shot given today  He has new hearing aids [facility]  Family history unchanged..... Father developed prostate cancer at age 22. No brothers 2 sisters in good health  14 point review of systems reviewed and are negative  Physical examination vital signs stable he is afebrile HEENT were negative except for new bilateral hearing aids. Neck was supple thyroid was normal no carotid bruits chest is clear to auscultation cardiac exam is normal abdominal exam was normal genitalia normal circumcised male rectum normal stool guaiac-negative pressing normal extremities normal skin no peripheral pulses normal except for 2 lesions one is a sebaceous cyst just to left of T10 on his back that is a lipoma in his right axillary area. He says neither of which are bothering him therefore will leave him alone  Impression #1 hypertension at goal........ continue current therapy monitor BP at home weekly........Marland Kitchen goal 135/85 or less  #2 hearing loss......Marland Kitchen bilateral hearing aids via [facility]  #3 family history of prostate cancer.......Marland Kitchen DRE yearly along with PSA

## 2017-01-25 ENCOUNTER — Encounter: Payer: Self-pay | Admitting: Gastroenterology

## 2017-05-09 ENCOUNTER — Telehealth: Payer: Self-pay | Admitting: Family Medicine

## 2017-05-09 MED ORDER — LISINOPRIL-HYDROCHLOROTHIAZIDE 10-12.5 MG PO TABS: 1.0000 | ORAL_TABLET | Freq: Every day | ORAL | 0 refills | Status: DC

## 2017-05-09 NOTE — Telephone Encounter (Signed)
Pt needs new rx lisinopril hctz 10-12.5 mg #90 w/refills. Pt has new pharm [facility] fax # [phone] and phone [phone]

## 2017-09-29 ENCOUNTER — Ambulatory Visit (INDEPENDENT_AMBULATORY_CARE_PROVIDER_SITE_OTHER): Payer: 59 | Admitting: Family Medicine

## 2017-09-29 ENCOUNTER — Encounter: Payer: Self-pay | Admitting: Family Medicine

## 2017-09-29 VITALS — BP 150/90 | HR 67 | Temp 97.4°F | Wt 191.0 lb

## 2017-09-29 DIAGNOSIS — L2089 Other atopic dermatitis: Secondary | ICD-10-CM

## 2017-09-29 DIAGNOSIS — Z23 Encounter for immunization: Secondary | ICD-10-CM | POA: Diagnosis not present

## 2017-09-29 MED ORDER — PREDNISONE 10 MG PO TABS: ORAL_TABLET | ORAL | 0 refills | Status: DC

## 2017-09-29 MED ORDER — HYDROXYZINE HCL 25 MG PO TABS: 25.0000 mg | ORAL_TABLET | Freq: Three times a day (TID) | ORAL | 0 refills | Status: DC | PRN

## 2017-09-29 NOTE — Progress Notes (Signed)
Subjective:    Patient ID: Daniel Harding, male    DOB: 1955-11-04, 61 y.o.   MRN: [id number]  Chief Complaint  Patient presents with  . Rash    HPI Patient was seen today for rash.  Pt with h/o rash x yrs.  This latest episode occurred 1 month ago.  Rash started as a small erythematous area on flexural crease of L arm.  Rash is now on patient's arms bilaterally, eyebrows, armpits, anterior neck.  Patient has tried hydrocortisone cream which helps with the itching but makes the rash appear more erythematous.  Rash typically happens around this time of year.  Patient denies changes in soaps, detergents, lotions.  Patient uses Cetaphil cleanser daily.  No past medical history on file.  No Known Allergies  ROS General: Denies fever, chills, night sweats, changes in weight, changes in appetite HEENT: Denies headaches, ear pain, changes in vision, rhinorrhea, sore throat CV: Denies CP, palpitations, SOB, orthopnea Pulm: Denies SOB, cough, wheezing GI: Denies abdominal pain, nausea, vomiting, diarrhea, constipation GU: Denies dysuria, hematuria, frequency, vaginal discharge Msk: Denies muscle cramps, joint pains Neuro: Denies weakness, numbness, tingling Skin: Denies bruising  +rash Psych: Denies depression, anxiety, hallucinations     Objective:    Blood pressure (!) 150/90, pulse 67, temperature (!) 97.4 F (36.3 C), temperature source Oral, weight 191 lb (86.6 kg).   Gen. Pleasant, well-nourished, in no distress, normal affect  HEENT: [place]/AT, face symmetric, no scleral icterus, PERRLA, nares patent without drainage Lungs: no accessory muscle use, CTAB, no wheezes or rales Cardiovascular: RRR, no m/r/g, no peripheral edema Neuro:  A&Ox3, CN II-XII intact, normal gait Skin:  Warm, dry, intact.  Erythematous, dry plaques on bilateral arms, anterior neck, face   Wt Readings from Last 3 Encounters:  09/29/17 191 lb (86.6 kg)  08/15/16 190 lb 4.8 oz (86.3 kg)  09/25/15 184 lb (83.5  kg)    Lab Results  Component Value Date   WBC 5.8 08/10/2016   HGB 14.4 08/10/2016   HCT 42.8 08/10/2016   PLT 229.0 08/10/2016   GLUCOSE 103 (H) 08/10/2016   CHOL 221 (H) 08/10/2016   TRIG 113.0 08/10/2016   HDL 52.40 08/10/2016   LDLDIRECT 172.5 08/13/2013   LDLCALC 146 (H) 08/10/2016   ALT 21 08/10/2016   AST 21 08/10/2016   NA 138 08/10/2016   K 5.5 (H) 08/10/2016   CL 102 08/10/2016   CREATININE 0.97 08/10/2016   BUN 16 08/10/2016   CO2 29 08/10/2016   TSH 2.59 08/10/2016   PSA 0.44 08/10/2016    Assessment/Plan:  Other atopic dermatitis  -likely a variant of eczema. -discussed using a good moisturizer daily.  Taking warm, not hot showers. - Plan: hydrOXYzine (ATARAX/VISTARIL) 25 MG tablet, predniSONE (DELTASONE) 10 MG tablet, Ambulatory referral to Dermatology  F/u prn for skin.  Pt to make an appt for next wk for other concerns.

## 2017-09-29 NOTE — Patient Instructions (Addendum)

## 2017-10-09 ENCOUNTER — Ambulatory Visit: Payer: 59 | Admitting: Family Medicine

## 2017-11-22 ENCOUNTER — Other Ambulatory Visit: Payer: Self-pay | Admitting: Family Medicine

## 2018-05-02 ENCOUNTER — Ambulatory Visit: Payer: 59 | Admitting: Family Medicine

## 2018-05-02 ENCOUNTER — Encounter: Payer: Self-pay | Admitting: Family Medicine

## 2018-05-02 VITALS — BP 140/80 | HR 71 | Temp 98.0°F | Wt 181.9 lb

## 2018-05-02 DIAGNOSIS — S70262A Insect bite (nonvenomous), left hip, initial encounter: Secondary | ICD-10-CM | POA: Diagnosis not present

## 2018-05-02 DIAGNOSIS — W57XXXA Bitten or stung by nonvenomous insect and other nonvenomous arthropods, initial encounter: Secondary | ICD-10-CM | POA: Diagnosis not present

## 2018-05-02 NOTE — Patient Instructions (Signed)

## 2018-05-02 NOTE — Progress Notes (Signed)
  Subjective:     Patient ID: Daniel Harding, male   DOB: 12-28-1955, 62 y.o.   MRN: [id number]  HPI Patient seen following tick bite which occurred over month ago. He brings in the tick today. This appears to be a small deer tick. Location of bite was left anterior hip region. He had some generalized fatigue which he realizes may be due to weather with working out in heat frequently. He has not had any fever. No skin rash. No arthralgias. He had said some general muscle achiness.  He takes lisinopril HCTZ for hypertension. No other regular medications.  No past medical history on file. Past Surgical History:  Procedure Laterality Date  . septal deviation      reports that he has quit smoking. He has never used smokeless tobacco. His alcohol and drug histories are not on file. family history includes Hypertension in his father. No Known Allergies   Review of Systems  Constitutional: Negative for chills and fever.  Respiratory: Negative for cough and shortness of breath.   Cardiovascular: Negative for chest pain.  Musculoskeletal: Negative for arthralgias.  Hematological: Negative for adenopathy.       Objective:   Physical Exam  Constitutional: He appears well-developed and well-nourished.  Cardiovascular: Normal rate and regular rhythm.  Pulmonary/Chest: Effort normal and breath sounds normal.  Musculoskeletal: He exhibits no edema.  Skin:  Patient has small punctate erythematous area at site of previous bite which is only about 4 mm diameter left anterior hip region. No pustules. No visible retained tick parts       Assessment:     Recent tick bite with a deer tick. Mild local allergic reaction. We explained that likelihood of Lyme disease is low given that this is a low endemic area and without classic erythema migrans. He describes some nonspecific fatigue issues which we suspect is more likely related to the heat and working outdoors frequently    Plan:     -Discussed  pros and cons of Lyme antibody testing. Patient would like to proceed with this. We explained risk of both false positive and false negative. Low clinical suspicion for Lyme disease.  [name] MD [facility]

## 2018-05-04 LAB — B. BURGDORFI ANTIBODIES: B burgdorferi Ab IgG+IgM: <0.9 index

## 2018-07-11 ENCOUNTER — Encounter: Payer: 59 | Admitting: Family Medicine

## 2018-07-23 ENCOUNTER — Ambulatory Visit (INDEPENDENT_AMBULATORY_CARE_PROVIDER_SITE_OTHER): Payer: 59 | Admitting: Family Medicine

## 2018-07-23 ENCOUNTER — Encounter: Payer: Self-pay | Admitting: Family Medicine

## 2018-07-23 VITALS — BP 158/78 | HR 62 | Temp 97.5°F | Ht 72.0 in | Wt 183.5 lb

## 2018-07-23 DIAGNOSIS — Z8042 Family history of malignant neoplasm of prostate: Secondary | ICD-10-CM

## 2018-07-23 DIAGNOSIS — Z0000 Encounter for general adult medical examination without abnormal findings: Secondary | ICD-10-CM | POA: Insufficient documentation

## 2018-07-23 DIAGNOSIS — Z23 Encounter for immunization: Secondary | ICD-10-CM | POA: Diagnosis not present

## 2018-07-23 DIAGNOSIS — Z8371 Family history of colonic polyps: Secondary | ICD-10-CM

## 2018-07-23 DIAGNOSIS — I10 Essential (primary) hypertension: Secondary | ICD-10-CM

## 2018-07-23 DIAGNOSIS — H9193 Unspecified hearing loss, bilateral: Secondary | ICD-10-CM

## 2018-07-23 LAB — POCT URINALYSIS DIPSTICK
Bilirubin, UA: NEGATIVE
Glucose, UA: NEGATIVE
KETONES UA: NEGATIVE
LEUKOCYTES UA: NEGATIVE
NITRITE UA: NEGATIVE
Odor: NEGATIVE
PROTEIN UA: NEGATIVE
RBC UA: NEGATIVE
SPEC GRAV UA: 1.015 (ref 1.010–1.025)
Urobilinogen, UA: 0.2 E.U./dL
pH, UA: 6 (ref 5.0–8.0)

## 2018-07-23 LAB — HEPATIC FUNCTION PANEL
ALBUMIN: 4.4 g/dL (ref 3.5–5.2)
ALK PHOS: 85 U/L (ref 39–117)
ALT: 16 U/L (ref 0–53)
AST: 19 U/L (ref 0–37)
Bilirubin, Direct: 0.1 mg/dL (ref 0.0–0.3)
TOTAL PROTEIN: 7.1 g/dL (ref 6.0–8.3)
Total Bilirubin: 0.5 mg/dL (ref 0.2–1.2)

## 2018-07-23 LAB — LIPID PANEL
CHOLESTEROL: 201 mg/dL — AB (ref 0–200)
HDL: 49.1 mg/dL (ref >39.00)
LDL Cholesterol: 132 mg/dL — ABNORMAL HIGH (ref 0–99)
NonHDL: 151.47
TRIGLYCERIDES: 97 mg/dL (ref 0.0–149.0)
Total CHOL/HDL Ratio: 4
VLDL: 19.4 mg/dL (ref 0.0–40.0)

## 2018-07-23 LAB — CBC WITH DIFFERENTIAL/PLATELET
BASOS ABS: 0 10*3/uL (ref 0.0–0.1)
Basophils Relative: 0.8 % (ref 0.0–3.0)
EOS ABS: 0.1 10*3/uL (ref 0.0–0.7)
Eosinophils Relative: 1.4 % (ref 0.0–5.0)
HCT: 41.9 % (ref 39.0–52.0)
Hemoglobin: 14.4 g/dL (ref 13.0–17.0)
LYMPHS ABS: 2 10*3/uL (ref 0.7–4.0)
Lymphocytes Relative: 37.4 % (ref 12.0–46.0)
MCHC: 34.5 g/dL (ref 30.0–36.0)
MCV: 89.9 fl (ref 78.0–100.0)
MONO ABS: 0.4 10*3/uL (ref 0.1–1.0)
Monocytes Relative: 6.6 % (ref 3.0–12.0)
NEUTROS ABS: 2.9 10*3/uL (ref 1.4–7.7)
Neutrophils Relative %: 53.8 % (ref 43.0–77.0)
PLATELETS: 204 10*3/uL (ref 150.0–400.0)
RBC: 4.66 Mil/uL (ref 4.22–5.81)
RDW: 12.3 % (ref 11.5–15.5)
WBC: 5.4 10*3/uL (ref 4.0–10.5)

## 2018-07-23 LAB — BASIC METABOLIC PANEL
BUN: 19 mg/dL (ref 6–23)
CALCIUM: 9.8 mg/dL (ref 8.4–10.5)
CHLORIDE: 99 meq/L (ref 96–112)
CO2: 31 meq/L (ref 19–32)
Creatinine, Ser: 0.98 mg/dL (ref 0.40–1.50)
GFR: 81.91 mL/min (ref >60.00)
Glucose, Bld: 93 mg/dL (ref 70–99)
Potassium: 5 mEq/L (ref 3.5–5.1)
Sodium: 137 mEq/L (ref 135–145)

## 2018-07-23 LAB — TSH: TSH: 2.71 u[IU]/mL (ref 0.35–4.50)

## 2018-07-23 LAB — PSA: PSA: 0.96 ng/mL (ref 0.10–4.00)

## 2018-07-23 MED ORDER — LISINOPRIL-HYDROCHLOROTHIAZIDE 10-12.5 MG PO TABS: 1.0000 | ORAL_TABLET | Freq: Every day | ORAL | 4 refills | Status: DC

## 2018-07-23 NOTE — Progress Notes (Signed)
Colter is a 62 year old married male nonsmoker who comes in today for physical evaluation because of a history of hypertension  He takes a half a tablet of lisinopril hydrochlorothiazide daily. He was originally on a full tablet. But his blood pressure dropped too low. He then decrease the dose to half a pill. On a half a tablet a day his blood pressure at home is 109/75. He states every time he comes in the office pressure goes up he checks it at home and it's normal. For the last 3 months she's had difficulty with his left shoulder. He thinks he had problems with that shoulder in the past. He would see an orthopedist had x-rays physical therapy and the pain and stiffness resolved. He works outdoors. 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 He also goes to gym daily. He's tried to stop doing upper body exercises because of the stiffness and pain in his left shoulder.  He has bilateral hearing aids from [facility]  He gets routine eye care, dental care, colonoscopy was 13 years ago. 2 years ago we gave him the name and number GI to call but he didn't do that. Asked him to call and get set up for follow-up colonoscopy. His father had colon polyps.  Vaccinations up-to-date information given on the new shingles vaccine  14 point review of systems reviewed otherwise negative except for his left shoulder  Social history.........Marland Kitchen Married lives in [place]. 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 His wife works at the ICU monitoring office across the hospital.  BP (!) 158/78 (BP Location: Right Arm, Patient Position: Sitting, Cuff Size: Large)   Pulse 62   Temp (!) 97.5 F (36.4 C) (Oral)   Ht 6' (1.829 m)   Wt 183 lb 8 oz (83.2 kg)   SpO2 99%   BMI 24.89 kg/m  Well-developed well-nourished male no acute distress vital signs stable he is afebrile except for BP as noted above. Examination of the HEENT was negative except for cerumen impaction right ear which  was irrigated by Tillie Rung.  Neck was supple thyroid is not enlarged no carotid bruits. Cardiopulmonary exam normal bowel exam normal genitalia normal circumcised male. Rectal normal stool guaiac-negative prostate normal. Extremities normal skin normal peripheral pulses normal  Orthopedic examination of left shoulder shows marked decreased range of motion. He can only supinate that arm to 90  #1 hypertension probably at goal......... BP check daily at home to be sure BP is at goal 135/85 or less. If not increase dose to a full tablet daily and repeat the process  #2 pain stiffness left shoulder......... PT consult  #3 family history of colon polyps...Marland KitchenMarland KitchenMarland Kitchen Father..... Number given to call GI  #4 bilateral hearing aids........... Cerumen impaction right ear relieved by irrigation

## 2018-07-23 NOTE — Patient Instructions (Signed)
Continue your current blood pressure medicine........ One half tab daily in the morning  Check your blood pressure daily in the morning for 7-10 days to be sure it's normal........Marland Kitchen 135/85 or less. If it's not normal increase to a full tablet daily and repeat the process.  We'll set you up a PT consult to loosen up that left shoulder........... Take Motrin 400 mg twice daily with food......... Ice packs at bedtime  Labs today......... We will call you if there is anything abnormal.  Call GI........... [phone] and asked to speak with GI to get set up for follow-up colonoscopy

## 2018-08-17 ENCOUNTER — Encounter: Payer: Self-pay | Admitting: Physical Therapy

## 2018-08-17 ENCOUNTER — Other Ambulatory Visit: Payer: Self-pay

## 2018-08-17 ENCOUNTER — Ambulatory Visit: Payer: 59 | Attending: Family Medicine | Admitting: Physical Therapy

## 2018-08-17 DIAGNOSIS — M25612 Stiffness of left shoulder, not elsewhere classified: Secondary | ICD-10-CM | POA: Insufficient documentation

## 2018-08-17 DIAGNOSIS — M25512 Pain in left shoulder: Secondary | ICD-10-CM | POA: Insufficient documentation

## 2018-08-17 NOTE — Therapy (Signed)
[place], [place], [address] Phone: [phone]   Fax:  [phone]  Physical Therapy Evaluation  Patient Details  Name: Daniel Harding MRN: [id number] Date of Birth: 1956/02/16 Referring Provider (PT): Stevie Kern, MD   Encounter Date: 08/17/2018  PT End of Session - 08/17/18 0913    Visit Number  1    Number of Visits  8    Date for PT Re-Evaluation  09/28/18    Authorization Type  [facility]    PT Start Time  0803    PT Stop Time  0852    PT Time Calculation (min)  49 min    Activity Tolerance  Patient tolerated treatment well    Behavior During Therapy  [facility] for tasks assessed/performed       History reviewed. No pertinent past medical history.  Past Surgical History:  Procedure Laterality Date  . septal deviation      There were no vitals filed for this visit.   Subjective Assessment - 08/17/18 0900    Subjective  Pt. reports 4 month history insidious onset left shoulder pain and stiffness. No mechanism of injury. Previous history of left frozen shoulder about 4-5 years ago resolved at the time with a few PT visits and home stretching program.    Pertinent History  Previous history frozen shoulder 4-5 years ago    Limitations  Lifting;Other (comment)   reaching activities   How long can you sit comfortably?  no limitations    How long can you stand comfortably?  no limitations    How long can you walk comfortably?  no limitations    Diagnostic tests  no current imaging    Patient Stated Goals  Improve shoulder tightness and ability to work out    Currently in Pain?  Other (Comment)   No pain at rest at eval. Pt. reports pain up to 7/10 at worst left shoulder with reaching described as sharp, aching. Pain aggravted with reaching, eased with rest        [facility] PT Assessment - 08/17/18 0001      Assessment   Medical Diagnosis  Left frozen shoulder/adhesive capsulitis    Referring Provider  (PT)  Stevie Kern, MD    Onset Date/Surgical Date  04/17/18   estimated per report 4 month history symptoms   Prior Therapy  past PT 4-5 years ago for previous episode      Precautions   Precautions  None      Restrictions   Weight Bearing Restrictions  No      Balance Screen   Has the patient fallen in the past 6 months  No      [place] residence    Living Arrangements  Spouse/significant other      Prior Function   Level of [place] with basic ADLs      Cognition   Overall Cognitive Status  Within Functional Limits for tasks assessed      Observation/Other Assessments   Focus on Therapeutic Outcomes (FOTO)   50% limited      ROM / Strength   AROM / PROM / Strength  AROM;PROM;Strength      AROM   AROM Assessment Site  Shoulder    Right/Left Shoulder  Right;Left    Right Shoulder Flexion  160 Degrees    Right Shoulder ABduction  170 Degrees    Right Shoulder Internal Rotation  70 Degrees  reach behind back to T10   Right Shoulder External Rotation  60 Degrees   at 0 deg abd, reach behind neck to T3   Left Shoulder Flexion  120 Degrees    Left Shoulder ABduction  90 Degrees    Left Shoulder Internal Rotation  70 Degrees   reach behind back to glut region   Left Shoulder External Rotation  60 Degrees   able to reach back of head but no lower     PROM   PROM Assessment Site  Shoulder    Right/Left Shoulder  Left    Left Shoulder Flexion  140 Degrees    Left Shoulder ABduction  130 Degrees    Left Shoulder Internal Rotation  70 Degrees    Left Shoulder External Rotation  60 Degrees      Strength   Overall Strength Comments  Bilat. shoulders grossly 5/5      Palpation   Palpation comment  Trigger point left middle deltoid and infraspinatus                Objective measurements completed on examination: See above findings.      [place] [facility] PT Treatment/Exercise - 08/17/18 0001       Exercises   Exercises  Shoulder      Shoulder Exercises: Stretch   Cross Chest Stretch  --   3x10 sec   External Rotation Stretch  --   5x10 sec with wand   Table Stretch - Flexion  --   3x10 sec   Table Stretch - Abduction  --   3x10 sec   Other Shoulder Stretches  sleeper stretch 3x10 sec             PT Education - 08/17/18 0912    Education Details  HEP, etiology frozen shoulder, POC    Person(s) Educated  Patient    Methods  Explanation;Handout    Comprehension  Verbalized understanding       PT Short Term Goals - 08/17/18 0919      PT SHORT TERM GOAL #1   Title  Independent with HEP    Time  3    Period  Weeks    Status  New      PT SHORT TERM GOAL #2   Title  Increase left shoulder flexion and abduction AROM 10-20 deg to improve ability for dressing, bathing    Time  3    Period  Weeks        PT Long Term Goals - 08/17/18 0920      PT LONG TERM GOAL #1   Title  Improve FOTO score 10% or greater    Baseline  50% limited    Time  6    Period  Weeks    Status  New      PT LONG TERM GOAL #2   Title  Left shoulder AROM grossly WFL to dress, bathe with LUE use    Time  6    Period  Weeks    Status  New      PT LONG TERM GOAL #3   Title  Dress, bathe and perform IADLs with left shoulder pain 3/10 or less    Baseline  7/10 pain at worst    Time  6    Period  Weeks    Status  New             Plan - 08/17/18 0914    Clinical Impression Statement  Pt.  presents with left shoulder pain and capsular restriction of ROM consistent with frozen shoulder/adhesive capsulitis. Pt. would benefit from PT to address current associated functional    History and Personal Factors relevant to plan of care:  Previous history frozen shoulder    Clinical Presentation  Stable    Clinical Presentation due to:  Symptoms stable, consistent with referring dx./etiology and expected progression    Clinical Decision Making  Low    Rehab Potential  Good    Clinical  Impairments Affecting Rehab Potential  past good response to PT, good past HEP compliance    PT Frequency  --   8 visits   PT Duration  6 weeks    PT Treatment/Interventions  ADLs/Self Care Home Management;Cryotherapy;Electrical Stimulation;Ultrasound;Moist Heat;Therapeutic activities;Therapeutic exercise;Patient/family education;Neuromuscular re-education;Manual techniques;Dry needling;Passive range of motion    PT Next Visit Plan  Review HEP as needed, left shoulder stretching, STM, joint mobs, possible trial dry needling, modalities prn    PT Home Exercise Plan  tables slides, ER stretch with wand, sleeper stretch, posterio capsule stretch    Consulted and Agree with Plan of Care  Patient       Patient will benefit from skilled therapeutic intervention in order to improve the following deficits and impairments:  Decreased range of motion, Decreased activity tolerance, Hypomobility, Pain  Visit Diagnosis: Left shoulder pain, unspecified chronicity  Stiffness of left shoulder, not elsewhere classified     Problem List Patient Active Problem List   Diagnosis Date Noted  . Routine general medical examination at a health care facility 07/23/2018  . Family history of prostate cancer in father 07/23/2018  . Family history of colonic polyps 07/23/2018  . Hearing loss associated with syndrome of both ears 08/15/2016  . Essential hypertension 09/16/2013   Beaulah Dinning, PT, DPT 08/17/18 9:25 AM  [facility] [address] [place], [place], [address] Phone: [phone]   Fax:  [phone]  Name: Daniel Harding MRN: [id number] Date of Birth: [birth year]

## 2018-08-17 NOTE — Patient Instructions (Signed)
Seated Shoulder Flexion Slide at Table Top with Forearm in Neutral   reps: 10 sets: 1-2   hold: 10 daily: 2-3   weekly: 7   Seated Shoulder Sc[place]ion Slide at Table Top with Forearm in Neutral   reps: 10 sets: 1-2   hold: 10 sec daily: 2-3   weekly: 7   Supine Shoulder External Rotation with [place]   reps: 10 sets: 1-2   hold: 10 daily: 2-3   weekly: 7   Sleeper Stretch   reps: 3-5 sets: 1   hold: 20 second daily: 1   weekly: 7   Standing Shoulder Posterior Capsule Stretch  reps: 2-3 sets: 1   hold: 20-30 sec daily: 2   weekly: 7

## 2018-08-31 ENCOUNTER — Ambulatory Visit: Payer: 59 | Admitting: Physical Therapy

## 2018-09-18 ENCOUNTER — Ambulatory Visit: Payer: 59 | Admitting: Physical Therapy

## 2018-09-19 ENCOUNTER — Encounter: Payer: Self-pay | Admitting: Family Medicine

## 2018-09-19 ENCOUNTER — Other Ambulatory Visit: Payer: Self-pay

## 2018-09-19 ENCOUNTER — Ambulatory Visit (INDEPENDENT_AMBULATORY_CARE_PROVIDER_SITE_OTHER): Payer: 59

## 2018-09-19 ENCOUNTER — Ambulatory Visit: Payer: 59 | Admitting: Family Medicine

## 2018-09-19 VITALS — BP 134/88 | HR 77 | Temp 97.7°F | Ht 72.0 in | Wt 183.8 lb

## 2018-09-19 DIAGNOSIS — R509 Fever, unspecified: Secondary | ICD-10-CM | POA: Diagnosis not present

## 2018-09-19 DIAGNOSIS — J189 Pneumonia, unspecified organism: Secondary | ICD-10-CM | POA: Diagnosis not present

## 2018-09-19 DIAGNOSIS — R059 Cough, unspecified: Secondary | ICD-10-CM

## 2018-09-19 DIAGNOSIS — R05 Cough: Secondary | ICD-10-CM

## 2018-09-19 MED ORDER — LEVOFLOXACIN 500 MG PO TABS: 500.0000 mg | ORAL_TABLET | Freq: Every day | ORAL | 0 refills | Status: DC

## 2018-09-19 NOTE — Patient Instructions (Signed)
Follow up for any persistent fever (>48 hours), recurrent vomiting, shortness of breath, or any confusion.    Stay well hydrated!

## 2018-09-19 NOTE — Progress Notes (Signed)
  Subjective:     Patient ID: Daniel Harding, male   DOB: 1956/02/26, 62 y.o.   MRN: [id number]  HPI Patient is seen with febrile illness.  Onset last Saturday.  He noted some chills body aches, fatigue and decreased appetite on Saturday.  By Sunday he took temperature which was 101.  By Monday he had temperature almost 103.  Has had some intermittent headaches.  No nausea, vomiting, or diarrhea.  Has had some cough which is relatively mild.  No sore throat.  No recent out of country travels.  Wife has had no symptoms.  Patient is generally healthy.  Non-smoker.  Takes low-dose lisinopril HCTZ for hypertension.  History reviewed. No pertinent past medical history. Past Surgical History:  Procedure Laterality Date  . septal deviation      reports that he has quit smoking. He has never used smokeless tobacco. His alcohol and drug histories are not on file. family history includes Hypertension in his father. No Known Allergies   Review of Systems  Constitutional: Positive for chills, fatigue and fever.  HENT: Negative for ear pain, sinus pain and sore throat.   Respiratory: Positive for cough. Negative for shortness of breath.   Gastrointestinal: Negative for abdominal pain, diarrhea, nausea and vomiting.  Genitourinary: Negative for dysuria.  Skin: Negative for rash.  Neurological: Positive for headaches.       Objective:   Physical Exam  Constitutional: He appears well-developed and well-nourished.  HENT:  Right Ear: External ear normal.  Left Ear: External ear normal.  Mouth/Throat: Oropharynx is clear and moist.  Neck: Neck supple.  Cardiovascular: Normal rate and regular rhythm.  Pulmonary/Chest: Effort normal.  He has some faint crackles left base.  No retractions.  No wheezes.  Pulse oximetry 98%.  Musculoskeletal: He exhibits no edema.  Lymphadenopathy:    He has no cervical adenopathy.  Neurological: He is alert.  Skin: No rash noted.       Assessment:      Patient presents with 4-day history of febrile illness with cough.  Nontoxic in appearance.  We initially considered influenza but rapid influenza screen negative.  Concern for community-acquired pneumonia involving left lung.  Patient is nontoxic in appearance and in no respiratory distress    Plan:     -Chest x-ray to be over read -Start Levaquin 500 mg daily for 10 days -Stay well-hydrated -Follow-up immediately for any increased shortness of breath, recurrent vomiting, confusion or for any fever persisting more than 48 hours  [name] MD [facility]

## 2018-09-21 ENCOUNTER — Ambulatory Visit: Payer: Self-pay | Admitting: *Deleted

## 2018-09-21 NOTE — Telephone Encounter (Addendum)
Spoke with patient and his symptoms have improved. Advised patient to schedule an appointment with the Saturday clinic if needed. Patient verbally agreed

## 2018-09-21 NOTE — Telephone Encounter (Signed)
   Reason for Disposition . [1] MODERATE-SEVERE hives persist (i.e., hives interfere with normal activities or work) AND [2] taking antihistamine (e.g., Benadryl, Claritin) > 24 hours    Patient has not started antihistamine and has just connected with provider on MyChart. Par provider- not convinced that this is allergic reaction to antibiotic- patient will start Benadryl for itching and hives to see if that helps- he will call for Saturday appointment if he is no better and feels he needs to be seen tomorrow. He is satisfied with that plan and will go to ED if he develops more severe allergic symptoms.  Answer Assessment - Initial Assessment Questions 1. APPEARANCE: "What does the rash look like?"      Raised bumps 2. LOCATION: "Where is the rash located?"      Back and stomach 3. NUMBER: "How many hives are there?"      numerous 4. SIZE: "How big are the hives?" (inches, cm, compare to coins) "Do they all look the same or is there lots of variation in shape and size?"      Abdomin and back 5. ONSET: "When did the hives begin?" (Hours or days ago)      Patient states he may have started before the [place] 6. ITCHING: "Does it itch?" If so, ask: "How bad is the itch?"    - MILD: doesn't interfere with normal activities   - MODERATE - SEVERE: interferes with work, school, sleep, or other activities      Moderate to severe 7. RECURRENT PROBLEM: "Have you had hives before?" If so, ask: "When was the last time?" and "What happened that time?"      No allergic reaction 8. TRIGGERS: "Were you exposed to any new food, plant, cosmetic product or animal just before the hives began?"     Not that patient can recall 9. OTHER SYMPTOMS: "Do you have any other symptoms?" (e.g., fever, tongue swelling, difficulty breathing, abdominal pain)     Fever has continued- 100.4 today- has been fluctuating  10. PREGNANCY: "Is there any chance you are pregnant?" "When was your last menstrual period?"        n/a  Protocols used: HIVES-A-AH

## 2018-09-21 NOTE — Telephone Encounter (Signed)
Daniel Harding isnt here so I am sending it to you. She may need to new pcp

## 2018-10-09 NOTE — Therapy (Signed)
[place], [place], [address] Phone: [phone]   Fax:  [phone]  Physical Therapy Evaluation/Discharge  Patient Details  Name: Daniel Harding MRN: [id number] Date of Birth: Oct 31, 1956 Referring Provider (PT): Stevie Kern, MD   Encounter Date: 08/17/2018    History reviewed. No pertinent past medical history.  Past Surgical History:  Procedure Laterality Date  . septal deviation      There were no vitals filed for this visit.                  Objective measurements completed on examination: See above findings.                PT Short Term Goals - 08/17/18 0919      PT SHORT TERM GOAL #1   Title  Independent with HEP    Time  3    Period  Weeks    Status  New      PT SHORT TERM GOAL #2   Title  Increase left shoulder flexion and abduction AROM 10-20 deg to improve ability for dressing, bathing    Time  3    Period  Weeks        PT Long Term Goals - 08/17/18 0920      PT LONG TERM GOAL #1   Title  Improve FOTO score 10% or greater    Baseline  50% limited    Time  6    Period  Weeks    Status  New      PT LONG TERM GOAL #2   Title  Left shoulder AROM grossly WFL to dress, bathe with LUE use    Time  6    Period  Weeks    Status  New      PT LONG TERM GOAL #3   Title  Dress, bathe and perform IADLs with left shoulder pain 3/10 or less    Baseline  7/10 pain at worst    Time  6    Period  Weeks    Status  New               Patient will benefit from skilled therapeutic intervention in order to improve the following deficits and impairments:  Decreased range of motion, Decreased activity tolerance, Hypomobility, Pain  Visit Diagnosis: Left shoulder pain, unspecified chronicity - Plan: PT plan of care cert/re-cert  Stiffness of left shoulder, not elsewhere classified - Plan: PT plan of care cert/re-cert     Problem List Patient  Active Problem List   Diagnosis Date Noted  . Routine general medical examination at a health care facility 07/23/2018  . Family history of prostate cancer in father 07/23/2018  . Family history of colonic polyps 07/23/2018  . Hearing loss associated with syndrome of both ears 08/15/2016  . Essential hypertension 09/16/2013       PHYSICAL THERAPY DISCHARGE SUMMARY  Visits from Start of Care: 1  Current functional level related to goals / functional outcomes: Unknown-pt. Did not return for further therapy after initial eval-was instructed in HEP at the time   Remaining deficits: Status unknown   Education / Equipment: NA Plan:  Patient goals were not met. Patient is being discharged due to not returning since the last visit.  ?????          Beaulah Dinning, PT, DPT 10/09/18 1:25 PM       [facility] [address] [place], [place], [address] Phone: [phone]   Fax:  [phone]  Name: Daniel Harding MRN: [id number] Date of Birth: 08/23/1956

## 2018-10-29 ENCOUNTER — Ambulatory Visit: Payer: 59 | Admitting: Family Medicine

## 2018-11-09 ENCOUNTER — Ambulatory Visit: Payer: 59 | Admitting: Family Medicine

## 2018-11-09 ENCOUNTER — Other Ambulatory Visit: Payer: Self-pay

## 2018-11-09 ENCOUNTER — Ambulatory Visit (INDEPENDENT_AMBULATORY_CARE_PROVIDER_SITE_OTHER): Payer: 59

## 2018-11-09 ENCOUNTER — Encounter: Payer: Self-pay | Admitting: Family Medicine

## 2018-11-09 VITALS — BP 134/76 | HR 88 | Temp 98.3°F | Ht 72.0 in | Wt 182.0 lb

## 2018-11-09 DIAGNOSIS — R05 Cough: Secondary | ICD-10-CM | POA: Diagnosis not present

## 2018-11-09 DIAGNOSIS — J189 Pneumonia, unspecified organism: Secondary | ICD-10-CM | POA: Diagnosis not present

## 2018-11-09 DIAGNOSIS — R059 Cough, unspecified: Secondary | ICD-10-CM

## 2018-11-09 MED ORDER — PREDNISONE 10 MG PO TABS: ORAL_TABLET | ORAL | 0 refills | Status: DC

## 2018-11-09 MED ORDER — ALBUTEROL SULFATE HFA 108 (90 BASE) MCG/ACT IN AERS: 2.0000 | INHALATION_SPRAY | RESPIRATORY_TRACT | 0 refills | Status: DC | PRN

## 2018-11-09 MED ORDER — DOXYCYCLINE HYCLATE 100 MG PO CAPS: 100.0000 mg | ORAL_CAPSULE | Freq: Two times a day (BID) | ORAL | 0 refills | Status: DC

## 2018-11-09 NOTE — Progress Notes (Signed)
  Subjective:     Patient ID: Daniel Harding, male   DOB: [birth year], 63 y.o.   MRN: [id number]  HPI Patient seen with some recurrent cough.  He was seen here November 20 and had patchy infiltrate right upper lobe and left lower lobe.  He was treated with Levaquin and pretty much symptomatically resolved.  He was feeling well until around Christmas time when he started developing some upper respiratory cold-like symptoms.  Past few days had some low-grade fever and worsening cough.  No dyspnea.  No hemoptysis.  He smoked only very lightly about 3 or 4 cigarettes/day for about 15 years but quit years ago.  Denies any recent appetite or weight changes.  His weight is only down 1 pound from last visit here in November.  No recent travels.  History reviewed. No pertinent past medical history. Past Surgical History:  Procedure Laterality Date  . septal deviation      reports that he has quit smoking. He has never used smokeless tobacco. No history on file for alcohol and drug. family history includes Hypertension in his father. No Known Allergies   Review of Systems  Constitutional: Negative for chills, fever and unexpected weight change.  Respiratory: Positive for cough and wheezing.   Cardiovascular: Negative for chest pain and leg swelling.       Objective:   Physical Exam Constitutional:      Appearance: Normal appearance.  Cardiovascular:     Rate and Rhythm: Normal rate and regular rhythm.  Pulmonary:     Comments: He has diffuse expiratory wheezing.  No retractions.  No rales.  Pulse oximetry 98% Musculoskeletal:     Right lower leg: No edema.     Left lower leg: No edema.  Neurological:     Mental Status: He is alert.        Assessment:     Patient seen with recurrent cough.  Bilateral pneumonia as above back in November which was clinically improving with recurrent cough starting couple weeks ago.  He does have evidence for reactive airway changes on exam but is in no  respiratory distress    Plan:     -Repeat chest x-ray today to document resolution of prior pneumonia -prednisone taper starting at 40 mg daily -script for Doxycycline to start if any fever or worsening symptoms.  [name] MD [facility]

## 2018-11-09 NOTE — Patient Instructions (Signed)
Follow up for any fever or increased shortness of breath  We will call you with CXR results.

## 2018-12-05 DIAGNOSIS — M25512 Pain in left shoulder: Secondary | ICD-10-CM | POA: Diagnosis not present

## 2018-12-13 ENCOUNTER — Encounter: Payer: Self-pay | Admitting: Family Medicine

## 2019-01-04 ENCOUNTER — Telehealth: Payer: Self-pay | Admitting: Family Medicine

## 2019-01-04 MED ORDER — LISINOPRIL-HYDROCHLOROTHIAZIDE 10-12.5 MG PO TABS: 1.0000 | ORAL_TABLET | Freq: Every day | ORAL | 0 refills | Status: DC

## 2019-01-04 NOTE — Telephone Encounter (Signed)
Copied from [place] [id number]. Topic: Quick Communication - Rx Refill/Question >> Jan 04, 2019  4:05 PM Oneta Rack wrote:  Medication: 90 day supply lisinopril-hydrochlorothiazide (PRINZIDE,ZESTORETIC) 10-12.5 MG tablet  (patient has a scheduled appointment with Carolann Littler, MD 02/05/2019)    Has the patient contacted their pharmacy? Yes   (Agent: If yes, when and what did the pharmacy advise?) request was sent in 12/25/2018  [facility] ([place], [place] [phone] (Phone) [phone] (Fax)  Agent: Please be advised that RX refills may take up to 3 business days. We ask that you follow-up with your pharmacy.

## 2019-01-04 NOTE — Telephone Encounter (Signed)
Dr. Elease Hashimoto - Would you be able to refill this until pt establishes care with new provider? Thanks!

## 2019-01-04 NOTE — Telephone Encounter (Signed)
Rx sent 

## 2019-01-04 NOTE — Telephone Encounter (Signed)
Refills ok 

## 2019-01-07 ENCOUNTER — Telehealth: Payer: Self-pay

## 2019-01-07 NOTE — Telephone Encounter (Signed)
ok 

## 2019-01-07 NOTE — Telephone Encounter (Signed)
Can you set this up for Dr. Elease Hashimoto? Thank you!

## 2019-01-07 NOTE — Telephone Encounter (Signed)
Please advise.  Copied from [place] [id number]. Topic: General - Other >> Jan 04, 2019  4:18 PM Oneta Rack wrote: Relation to pt: self  Call back number: [phone]   Reason for call:  Since Dr. Sherren Mocha retired patient has been seeing Dr. Elease Hashimoto the last few acute visits and patient would like to transfer care, please advise

## 2019-01-08 NOTE — Telephone Encounter (Signed)
Pt approved to set up TOC appt.   LMTCB

## 2019-02-04 ENCOUNTER — Telehealth: Payer: Self-pay | Admitting: Family Medicine

## 2019-02-04 NOTE — Telephone Encounter (Signed)
Copied from [place] #[id number]. Topic: Appointment Scheduling - Scheduling Inquiry for Clinic >> Feb 04, 2019  3:31 PM Blase Mess A wrote: CRM for notification. See Telephone encounter for: 02/04/19.  Patient is calling to cancel his appt for 02/05/19 with Dr. Elease Hashimoto.

## 2019-02-04 NOTE — Telephone Encounter (Signed)
Called to advise pt his visit could be done via virtual visit so he would not have to come in to office. If he would still like to cancel please do so.   LMTCB

## 2019-02-05 ENCOUNTER — Ambulatory Visit: Payer: 59 | Admitting: Family Medicine

## 2019-02-05 ENCOUNTER — Telehealth: Payer: Self-pay | Admitting: Family Medicine

## 2019-02-05 ENCOUNTER — Telehealth: Payer: Self-pay

## 2019-02-05 NOTE — Telephone Encounter (Signed)
See other TE This has been taken care of Nothing further needed.

## 2019-02-05 NOTE — Telephone Encounter (Signed)
Lm for patient to return call.

## 2019-02-05 NOTE — Telephone Encounter (Signed)
Nothing further needed.  appt was already changed over

## 2019-02-05 NOTE — Telephone Encounter (Signed)
Called patient and left a detailed voice message to see how he wanted to do his visit today, either WebEx or Doxy.  OK for PEC to discuss/advise/schedule  CRM Created.

## 2019-02-05 NOTE — Telephone Encounter (Signed)
Copied from [place] [id number]. Topic: Quick Communication - See Telephone Encounter >> Feb 05, 2019  8:13 AM Loma [place] wrote: CRM for notification. See Telephone encounter for: 02/05/19. Pt was called this am for a virtual instead of his appt at 9:00 am today. He held to reschedule but lines were busy. He would like someone to touch base with him on resch. Virtual appt. He wants to cancel for today- burchette

## 2019-02-06 ENCOUNTER — Other Ambulatory Visit: Payer: Self-pay

## 2019-02-06 ENCOUNTER — Ambulatory Visit (INDEPENDENT_AMBULATORY_CARE_PROVIDER_SITE_OTHER): Payer: 59 | Admitting: Family Medicine

## 2019-02-06 DIAGNOSIS — I10 Essential (primary) hypertension: Secondary | ICD-10-CM | POA: Diagnosis not present

## 2019-02-06 NOTE — Progress Notes (Signed)
Patient ID: Daniel Harding, male   DOB: Jul 09, 1956, 63 y.o.   MRN: [id number]  Virtual Visit via Video Note  I connected with Daniel Harding on 02/06/19 at  9:00 AM EDT by a video enabled telemedicine application and verified that I am speaking with the correct person using two identifiers.  Location patient: home Location provider:work or home office Persons participating in the virtual visit: patient, provider  I discussed the limitations of evaluation and management by telemedicine and the availability of in person appointments. The patient expressed understanding and agreed to proceed.   HPI: Patient encounter via video visit for hypertension follow-up.  He takes lisinopril HCTZ 1/2 tablet daily.  Blood pressures been very stable.  Blood pressure this morning 128/77.  Denies any dizziness.  No headaches.  No chest pains.  He had pneumonia several months ago and feels like his lung capacity is not been quite the same since then.  He did have follow-up chest x-ray in January which showed resolution of the pneumonia.  He is not had any recent cough or fever.  Staying active with regular exercise.   ROS: See pertinent positives and negatives per HPI.  No past medical history on file.  Past Surgical History:  Procedure Laterality Date  . septal deviation      Family History  Problem Relation Age of Onset  . Hypertension Father     SOCIAL HX: non-smoker   Current Outpatient Medications:  .  albuterol (PROVENTIL HFA;VENTOLIN HFA) 108 (90 Base) MCG/ACT inhaler, Inhale 2 puffs into the lungs every 4 (four) hours as needed for wheezing or shortness of breath., Disp: 1 Inhaler, Rfl: 0 .  aspirin 81 MG tablet, Take 81 mg by mouth daily.  , Disp: , Rfl:  .  lisinopril-hydrochlorothiazide (PRINZIDE,ZESTORETIC) 10-12.5 MG tablet, Take 1 tablet by mouth daily., Disp: 90 tablet, Rfl: 0 .  multivitamin (THERAGRAN) per tablet, Take 1 tablet by mouth daily.  , Disp: , Rfl:   EXAM:  VITALS per  patient if applicable:  GENERAL: alert, oriented, appears well and in no acute distress  HEENT: atraumatic, conjunttiva clear, no obvious abnormalities on inspection of external nose and ears  NECK: normal movements of the head and neck  LUNGS: on inspection no signs of respiratory distress, breathing rate appears normal, no obvious gross SOB, gasping or wheezing  CV: no obvious cyanosis  MS: moves all visible extremities without noticeable abnormality  PSYCH/NEURO: pleasant and cooperative, no obvious depression or anxiety, speech and thought processing grossly intact  ASSESSMENT AND PLAN:  Discussed the following assessment and plan:  Hypertension-stable -Continue lisinopril HCTZ -Consider repeat physical by next fall and obtain screening labs then     I discussed the assessment and treatment plan with the patient. The patient was provided an opportunity to ask questions and all were answered. The patient agreed with the plan and demonstrated an understanding of the instructions.   The patient was advised to call back or seek an in-person evaluation if the symptoms worsen or if the condition fails to improve as anticipated.   Daniel Littler, MD

## 2019-07-16 ENCOUNTER — Other Ambulatory Visit: Payer: Self-pay | Admitting: Family Medicine

## 2019-08-15 DIAGNOSIS — H6123 Impacted cerumen, bilateral: Secondary | ICD-10-CM | POA: Diagnosis not present

## 2019-08-15 DIAGNOSIS — H903 Sensorineural hearing loss, bilateral: Secondary | ICD-10-CM | POA: Diagnosis not present

## 2019-09-03 DIAGNOSIS — Z20828 Contact with and (suspected) exposure to other viral communicable diseases: Secondary | ICD-10-CM | POA: Diagnosis not present

## 2019-11-27 DIAGNOSIS — H43812 Vitreous degeneration, left eye: Secondary | ICD-10-CM | POA: Diagnosis not present

## 2019-11-27 DIAGNOSIS — Z83511 Family history of glaucoma: Secondary | ICD-10-CM | POA: Diagnosis not present

## 2019-11-27 DIAGNOSIS — G43109 Migraine with aura, not intractable, without status migrainosus: Secondary | ICD-10-CM | POA: Diagnosis not present

## 2019-11-27 DIAGNOSIS — H31092 Other chorioretinal scars, left eye: Secondary | ICD-10-CM | POA: Diagnosis not present

## 2019-11-27 DIAGNOSIS — H33312 Horseshoe tear of retina without detachment, left eye: Secondary | ICD-10-CM | POA: Diagnosis not present

## 2019-11-27 DIAGNOSIS — H40003 Preglaucoma, unspecified, bilateral: Secondary | ICD-10-CM | POA: Diagnosis not present

## 2019-12-28 ENCOUNTER — Ambulatory Visit: Payer: 59 | Attending: Internal Medicine

## 2019-12-28 DIAGNOSIS — Z23 Encounter for immunization: Secondary | ICD-10-CM | POA: Insufficient documentation

## 2019-12-28 NOTE — Progress Notes (Signed)
   Covid-19 Vaccination Clinic  Name:  Daniel Harding    MRN: [id number] DOB: Jul 02, 1956  12/28/2019  Mr. Daniel Harding was observed post Covid-19 immunization for 15 minutes without incidence. He was provided with Vaccine Information Sheet and instruction to access the V-Safe system.   Mr. Daniel Harding was instructed to call 911 with any severe reactions post vaccine: Marland Kitchen Difficulty breathing  . Swelling of your face and throat  . A fast heartbeat  . A bad rash all over your body  . Dizziness and weakness    Immunizations Administered    Name Date Dose VIS Date Route   Moderna COVID-19 Vaccine 12/28/2019 10:15 AM 0.5 mL 10/01/2019 Intramuscular   Manufacturer: Moderna   Lot: [id number]   [place]:[id number]

## 2019-12-29 ENCOUNTER — Ambulatory Visit: Payer: 59

## 2020-01-25 ENCOUNTER — Ambulatory Visit: Payer: 59 | Attending: Internal Medicine

## 2020-01-25 DIAGNOSIS — Z23 Encounter for immunization: Secondary | ICD-10-CM

## 2020-01-25 NOTE — Progress Notes (Signed)
   Covid-19 Vaccination Clinic  Name:  YNES WOLTERING    MRN: [id number] DOB: [birth year]  01/25/2020  Mr. Geimer was observed post Covid-19 immunization for 15 minutes without incident. He was provided with Vaccine Information Sheet and instruction to access the V-Safe system.   Mr. Kostelnik was instructed to call 911 with any severe reactions post vaccine: Marland Kitchen Difficulty breathing  . Swelling of face and throat  . A fast heartbeat  . A bad rash all over body  . Dizziness and weakness   Immunizations Administered    Name Date Dose VIS Date Route   Moderna COVID-19 Vaccine 01/25/2020  2:49 PM 0.5 mL 10/01/2019 Intramuscular   Manufacturer: Levan Hurst   LotUD:[id number]   [id number]

## 2020-01-28 ENCOUNTER — Ambulatory Visit: Payer: 59

## 2020-07-13 ENCOUNTER — Other Ambulatory Visit: Payer: Self-pay | Admitting: Family Medicine

## 2020-07-16 ENCOUNTER — Telehealth: Payer: Self-pay | Admitting: Family Medicine

## 2020-07-16 NOTE — Telephone Encounter (Signed)
Pt said he was a pt of Dr. Sherren Mocha and saw Dr. Elease Hashimoto a few times in the past and wanted to know if he could take him as a new pt.  Please advise and I will call the pt with an update

## 2020-07-17 NOTE — Telephone Encounter (Signed)
Can you please schedule pt for new pt as Dr. Elease Hashimoto agreed to see them. Thank you

## 2020-07-17 NOTE — Telephone Encounter (Signed)
I will be happy to see him.

## 2020-07-17 NOTE — Telephone Encounter (Signed)
Are you taking any new pt?

## 2020-07-19 IMAGING — DX DG CHEST 2V
2 series · 2 of 2 positions shown · non-contrast
Comparison: 09/19/2018

CLINICAL DATA: Recurrent cough.  Recent pneumonia.

EXAM:
CHEST - 2 VIEW

[chest pa]
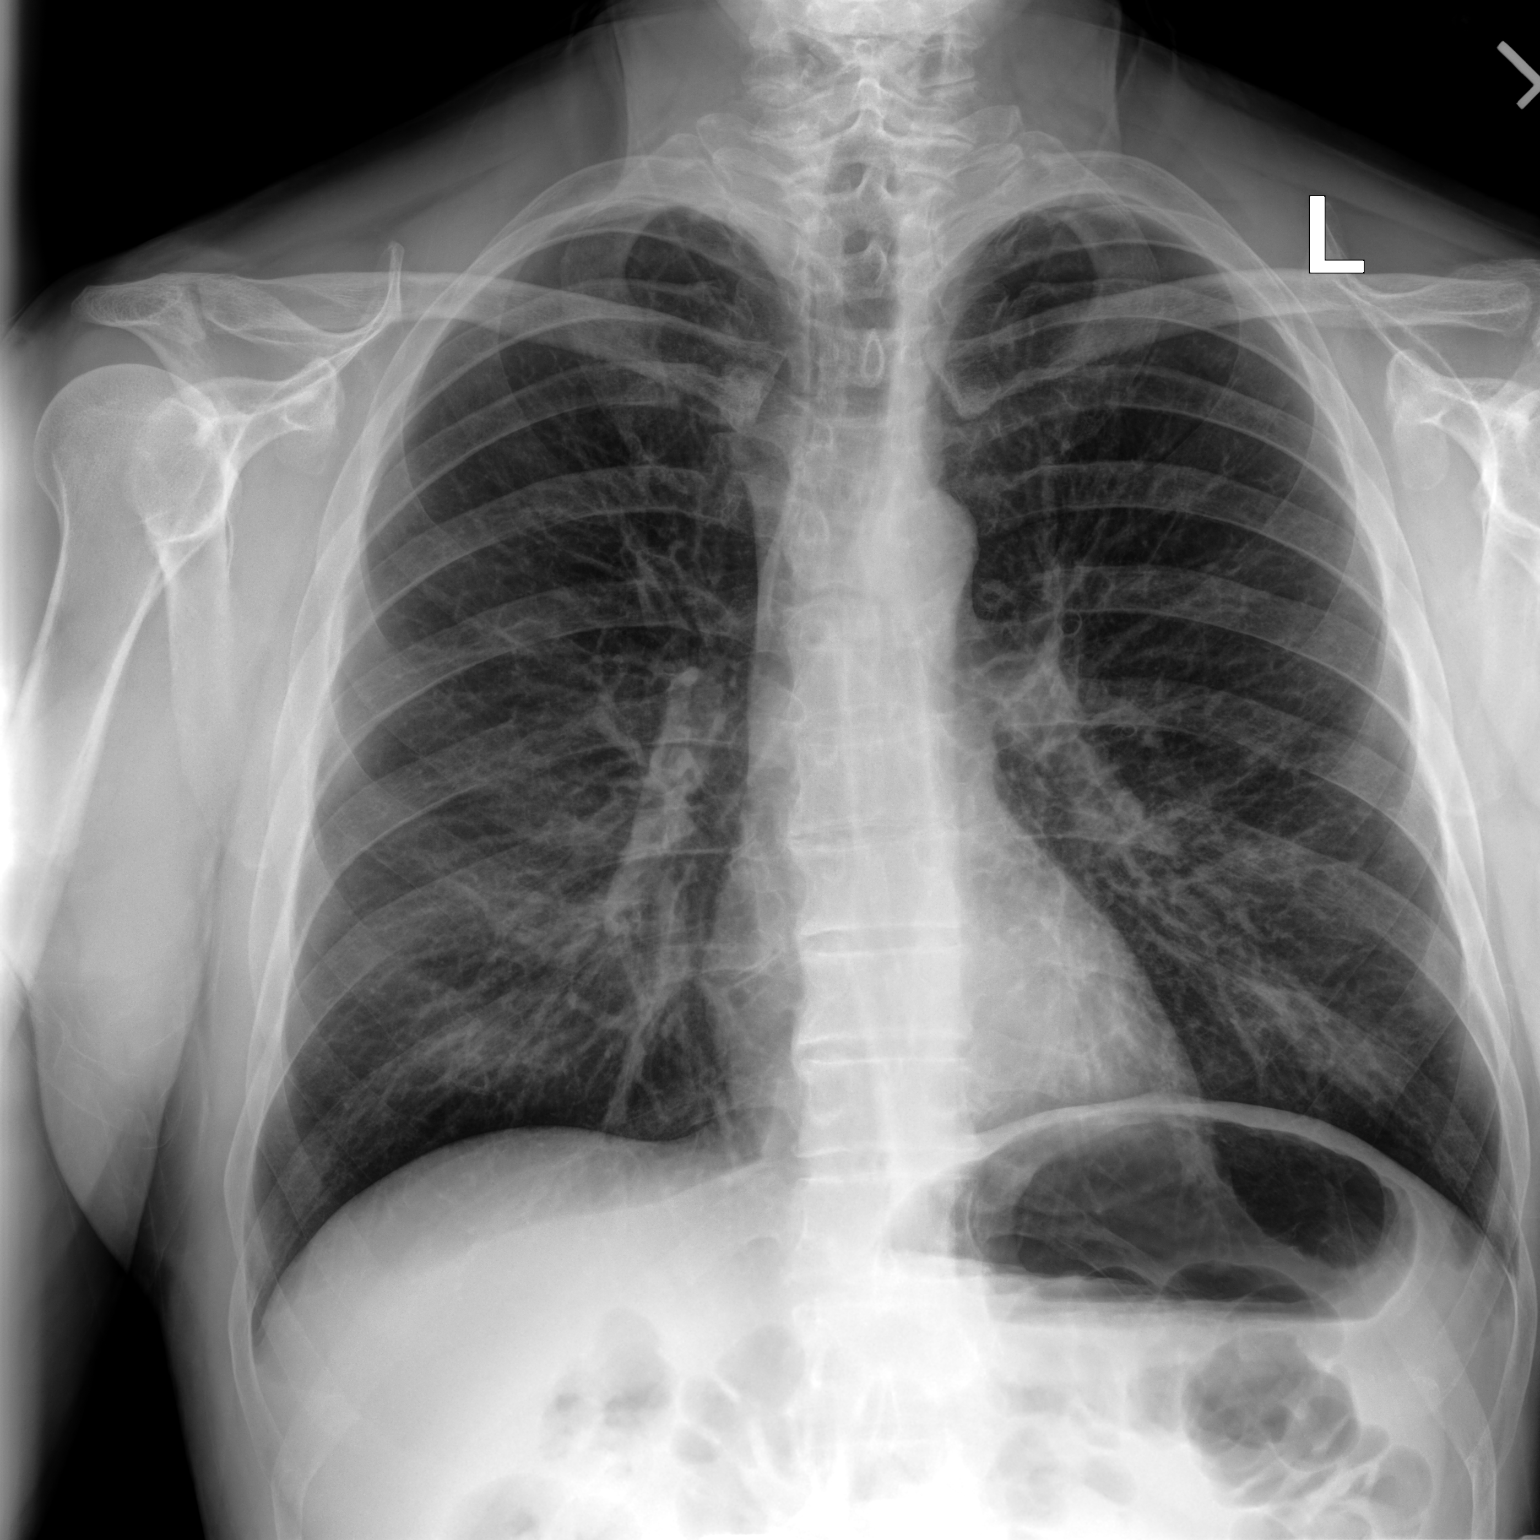

[chest lat]
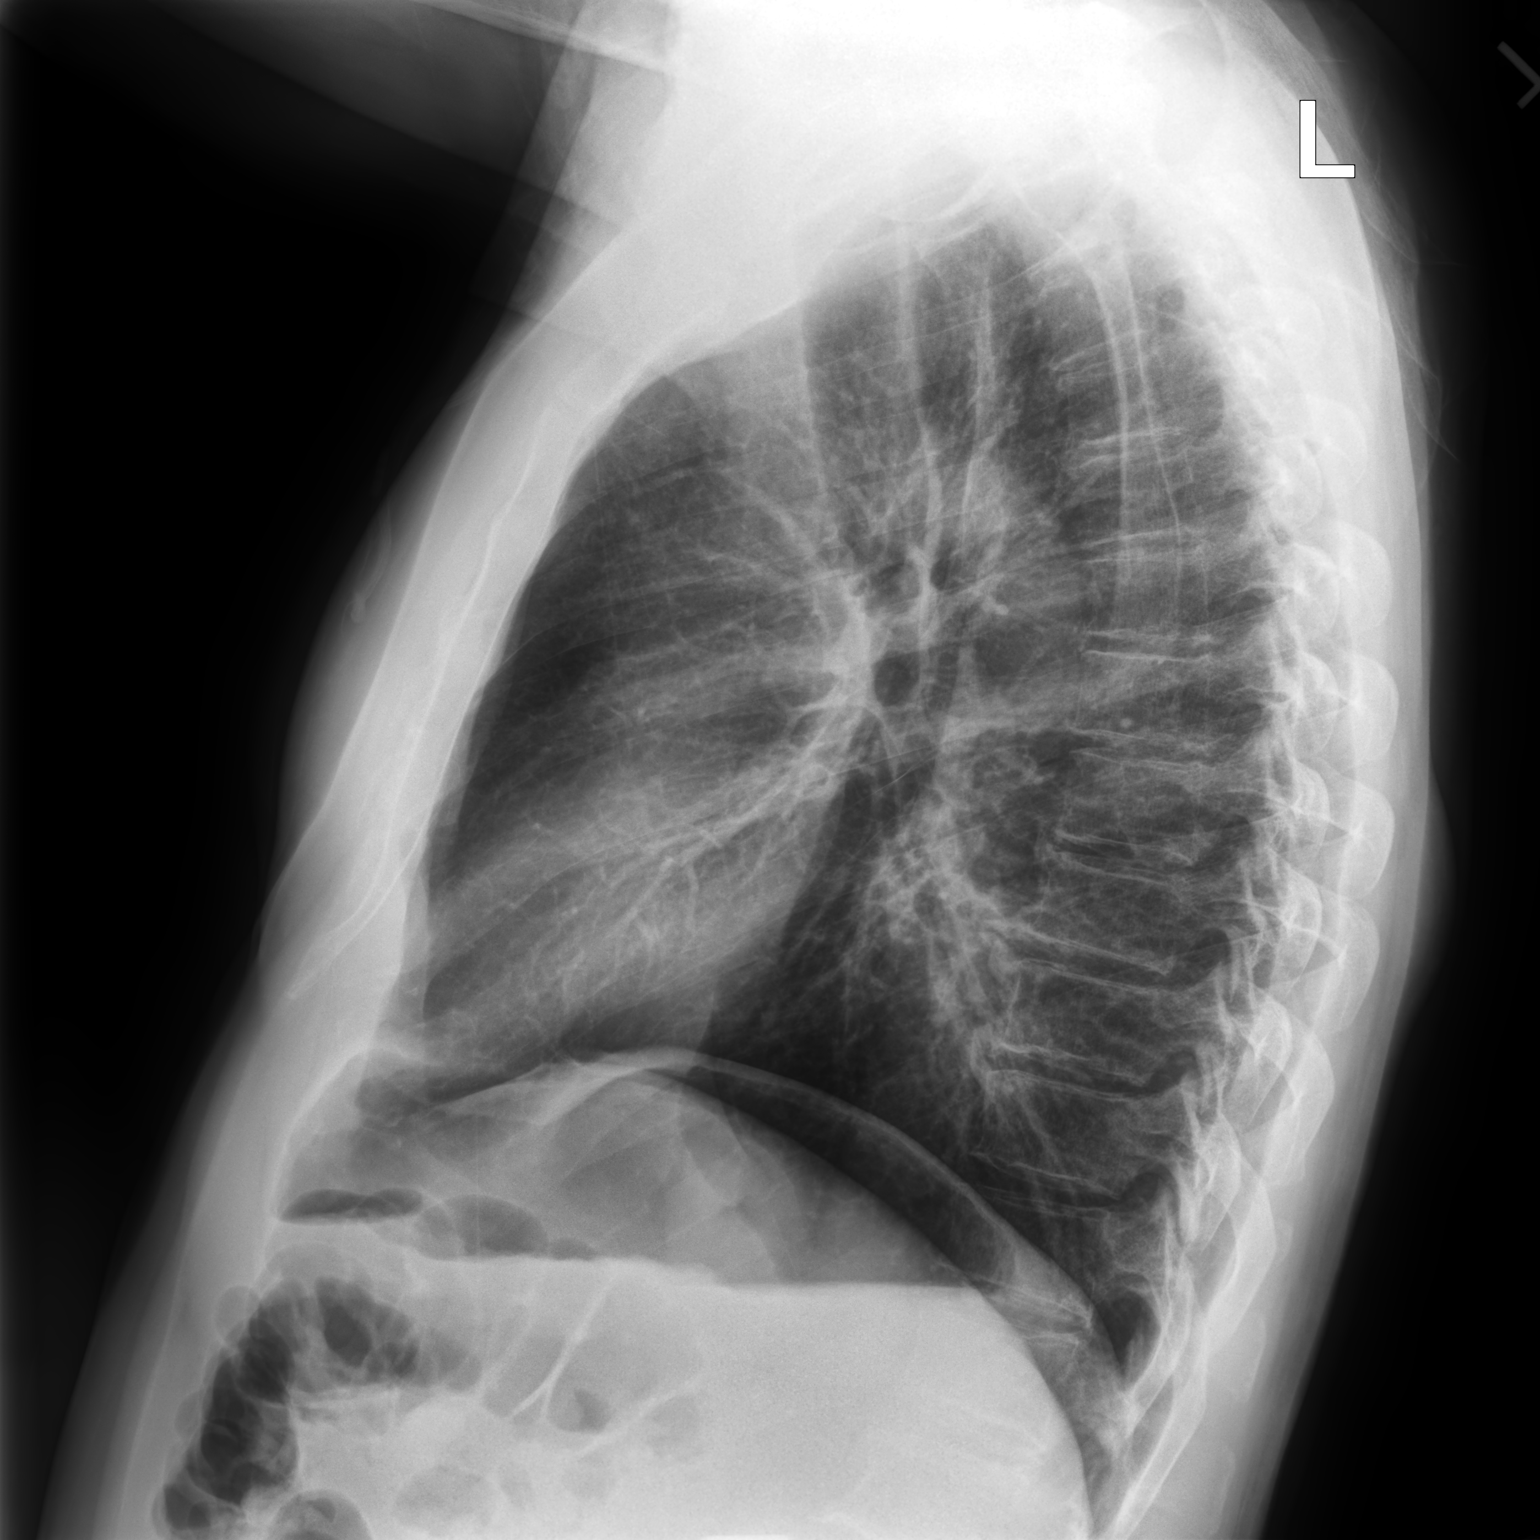

[2 of 2 positions shown; findings below may reference images not displayed]

FINDINGS: The heart size and mediastinal contours are within normal limits.
Previously seen areas of airspace opacity in the right upper lobe
and left lower lobe have resolved since previous study. No evidence
of pleural effusion.
IMPRESSION: No active cardiopulmonary disease.

## 2020-07-20 NOTE — Telephone Encounter (Signed)
Tried calling pt to schedule an appointment. Left Vm for a return call

## 2020-07-21 ENCOUNTER — Other Ambulatory Visit: Payer: Self-pay

## 2020-07-22 ENCOUNTER — Ambulatory Visit: Payer: 59 | Admitting: Family Medicine

## 2020-07-22 VITALS — BP 140/82 | HR 67 | Temp 97.9°F | Wt 185.9 lb

## 2020-07-22 DIAGNOSIS — I10 Essential (primary) hypertension: Secondary | ICD-10-CM

## 2020-07-22 MED ORDER — LISINOPRIL-HYDROCHLOROTHIAZIDE 10-12.5 MG PO TABS: 1.0000 | ORAL_TABLET | Freq: Every day | ORAL | 1 refills | Status: DC

## 2020-07-22 NOTE — Progress Notes (Signed)
Established Patient Office Visit  Subjective:  Patient ID: Daniel Harding, male    DOB: 1956/09/27  Age: 64 y.o. MRN: [id number]  CC:  Chief Complaint  Patient presents with  . Transitions Of Care    HPI Daniel Harding presents for transition to new provider.  He had seen Dr. Sherren Mocha for several years.  He is not new to this practice.  He had called recently needing refills of his blood pressure medication.  He has history of chronic bilateral hearing loss and hypertension but no other chronic medical problems.  He takes lisinopril HCTZ 10/12.5 mg currently 1/2 tablet daily.  Also takes baby aspirin 1 daily.  Has not had a physical in a couple years.  He plans to get flu vaccine later in October.  He has had Covid vaccine.  Turned 64 this past year.  No history of pneumonia vaccine.    Generally feels well.  He still works full-time.  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  His wife works with Daniel Harding as a Marine scientist with intensive care monitoring  Daniel Harding is a non-smoker.  His father had prostate cancer.  Daniel Harding last PSA was normal 2 years ago  No past medical history on file.  Past Surgical History:  Procedure Laterality Date  . septal deviation      Family History  Problem Relation Age of Onset  . Hypertension Father     Social History   Socioeconomic History  . Marital status: Married    Spouse name: Not on file  . Number of children: Not on file  . Years of education: Not on file  . Highest education level: Not on file  Occupational History  . Not on file  Tobacco Use  . Smoking status: Former Research scientist (life sciences)  . Smokeless tobacco: Never Used  Vaping Use  . Vaping Use: Never used  Substance and Sexual Activity  . Alcohol use: Not on file  . Drug use: Not on file  . Sexual activity: Not on file  Other Topics Concern  . Not on file  Social History Narrative  . Not on file   Social Determinants of Health   Financial Resource Strain:   . Difficulty of Paying  Living Expenses: Not on file  Food Insecurity:   . Worried About Charity fundraiser in the Last Year: Not on file  . Ran Out of Food in the Last Year: Not on file  Transportation Needs:   . Lack of Transportation (Medical): Not on file  . Lack of Transportation (Non-Medical): Not on file  Physical Activity:   . Days of Exercise per Week: Not on file  . Minutes of Exercise per Session: Not on file  Stress:   . Feeling of Stress : Not on file  Social Connections:   . Frequency of Communication with Friends and Family: Not on file  . Frequency of Social Gatherings with Friends and Family: Not on file  . Attends Religious Services: Not on file  . Active Member of Clubs or Organizations: Not on file  . Attends Archivist Meetings: Not on file  . Marital Status: Not on file  Intimate Partner Violence:   . Fear of Current or Ex-Partner: Not on file  . Emotionally Abused: Not on file  . Physically Abused: Not on file  . Sexually Abused: Not on file    Outpatient Medications Prior to Visit  Medication Sig Dispense Refill  . albuterol (PROVENTIL HFA;VENTOLIN HFA) 108 (  90 Base) MCG/ACT inhaler Inhale 2 puffs into the lungs every 4 (four) hours as needed for wheezing or shortness of breath. 1 Inhaler 0  . aspirin 81 MG tablet Take 81 mg by mouth daily.      . multivitamin (THERAGRAN) per tablet Take 1 tablet by mouth daily.      Daniel Harding Kitchen lisinopril-hydrochlorothiazide (ZESTORETIC) 10-12.5 MG tablet TAKE 1 TABLET BY MOUTH DAILY 90 tablet 0   No facility-administered medications prior to visit.    No Known Allergies  ROS Review of Systems  Constitutional: Negative for fatigue.  Eyes: Negative for visual disturbance.  Respiratory: Negative for cough, chest tightness and shortness of breath.   Cardiovascular: Negative for chest pain, palpitations and leg swelling.  Neurological: Negative for dizziness, syncope, weakness, light-headedness and headaches.      Objective:      Physical Exam Constitutional:      Appearance: He is well-developed.  HENT:     Right Ear: External ear normal.     Left Ear: External ear normal.  Eyes:     Pupils: Pupils are equal, round, and reactive to light.  Neck:     Thyroid: No thyromegaly.  Cardiovascular:     Rate and Rhythm: Normal rate and regular rhythm.  Pulmonary:     Effort: Pulmonary effort is normal. No respiratory distress.     Breath sounds: Normal breath sounds. No wheezing or rales.  Musculoskeletal:     Cervical back: Neck supple.     Right lower leg: No edema.     Left lower leg: No edema.  Neurological:     Mental Status: He is alert and oriented to person, place, and time.     BP 140/82 (BP Location: Right Arm, Patient Position: Sitting, Cuff Size: Normal)   Pulse 67   Temp 97.9 F (36.6 C) (Oral)   Wt 185 lb 14.4 oz (84.3 kg)   SpO2 98%   BMI 25.21 kg/m  Wt Readings from Last 3 Encounters:  07/22/20 185 lb 14.4 oz (84.3 kg)  11/09/18 182 lb (82.6 kg)  09/19/18 183 lb 12.8 oz (83.4 kg)     Health Maintenance Due  Topic Date Due  . Hepatitis C Screening  Never done  . HIV Screening  Never done  . COLONOSCOPY  03/22/2017  . PNA vac Low Risk Adult (1 of 2 - PCV13) Never done  . INFLUENZA VACCINE  05/31/2020    There are no preventive care reminders to display for this patient.  Lab Results  Component Value Date   TSH 2.71 07/23/2018   Lab Results  Component Value Date   WBC 5.4 07/23/2018   HGB 14.4 07/23/2018   HCT 41.9 07/23/2018   MCV 89.9 07/23/2018   PLT 204.0 07/23/2018   Lab Results  Component Value Date   NA 137 07/23/2018   K 5.0 07/23/2018   CO2 31 07/23/2018   GLUCOSE 93 07/23/2018   BUN 19 07/23/2018   CREATININE 0.98 07/23/2018   BILITOT 0.5 07/23/2018   ALKPHOS 85 07/23/2018   AST 19 07/23/2018   ALT 16 07/23/2018   PROT 7.1 07/23/2018   ALBUMIN 4.4 07/23/2018   CALCIUM 9.8 07/23/2018   GFR 81.91 07/23/2018   Lab Results  Component Value Date    CHOL 201 (H) 07/23/2018   Lab Results  Component Value Date   HDL 49.10 07/23/2018   Lab Results  Component Value Date   LDLCALC 132 (H) 07/23/2018   Lab Results  Component  Value Date   TRIG 97.0 07/23/2018   Lab Results  Component Value Date   CHOLHDL 4 07/23/2018   No results found for: HGBA1C    Assessment & Plan:   Hypertension.  Borderline elevated reading today.  Apparently has been controlled by home readings.  Recommend close monitoring of the next few weeks and be in touch if consistently greater than 379 systolic.  Refill lisinopril HCTZ for 1 year.  -Recommend set up complete physical at some point later this year  Meds ordered this encounter  Medications  . lisinopril-hydrochlorothiazide (ZESTORETIC) 10-12.5 MG tablet    Sig: Take 1 tablet by mouth daily.    Dispense:  90 tablet    Refill:  1    Follow-up: No follow-ups on file.    Carolann Littler, MD

## 2020-07-22 NOTE — Patient Instructions (Signed)
Consider setting up complete physical this next year  I refilled medication for one year.  Remember flu vaccine this Fall.

## 2020-10-27 ENCOUNTER — Encounter: Payer: Self-pay | Admitting: Gastroenterology

## 2020-11-09 ENCOUNTER — Other Ambulatory Visit: Payer: Self-pay

## 2020-11-09 ENCOUNTER — Other Ambulatory Visit: Payer: Self-pay | Admitting: Gastroenterology

## 2020-11-09 ENCOUNTER — Ambulatory Visit (AMBULATORY_SURGERY_CENTER): Payer: Self-pay

## 2020-11-09 VITALS — Ht 72.0 in | Wt 191.0 lb

## 2020-11-09 DIAGNOSIS — Z1211 Encounter for screening for malignant neoplasm of colon: Secondary | ICD-10-CM

## 2020-11-09 MED ORDER — NA SULFATE-K SULFATE-MG SULF 17.5-3.13-1.6 GM/177ML PO SOLN: 1.0000 | Freq: Once | ORAL | 0 refills | Status: DC

## 2020-11-09 MED FILL — SUPREP BOWEL PREP KIT: 17.5-3.13-1 | 1 days supply | Qty: 354 | Fill #0

## 2020-11-09 NOTE — Progress Notes (Signed)
No egg or soy allergy known to patient  No issues with past sedation with any surgeries or procedures No intubation problems in the past  No FH of Malignant Hyperthermia No diet pills per patient No home 02 use per patient  No blood thinners per patient  Pt denies issues with constipation  No A fib or A flutter  EMMI video to pt or via [place] 19 guidelines implemented in PV today with Pt and RN  Pt is fully vaccinated  for Covid   Suprep, [facility]  Due to the COVID-19 pandemic we are asking patients to follow certain guidelines.  Pt aware of COVID protocols and [facility] guidelines

## 2020-11-10 DIAGNOSIS — D171 Benign lipomatous neoplasm of skin and subcutaneous tissue of trunk: Secondary | ICD-10-CM | POA: Diagnosis not present

## 2020-11-10 DIAGNOSIS — D2262 Melanocytic nevi of left upper limb, including shoulder: Secondary | ICD-10-CM | POA: Diagnosis not present

## 2020-11-10 DIAGNOSIS — L570 Actinic keratosis: Secondary | ICD-10-CM | POA: Diagnosis not present

## 2020-11-10 DIAGNOSIS — L718 Other rosacea: Secondary | ICD-10-CM | POA: Diagnosis not present

## 2020-11-10 DIAGNOSIS — D2261 Melanocytic nevi of right upper limb, including shoulder: Secondary | ICD-10-CM | POA: Diagnosis not present

## 2020-11-10 DIAGNOSIS — D225 Melanocytic nevi of trunk: Secondary | ICD-10-CM | POA: Diagnosis not present

## 2020-11-10 DIAGNOSIS — L821 Other seborrheic keratosis: Secondary | ICD-10-CM | POA: Diagnosis not present

## 2020-11-18 ENCOUNTER — Encounter: Payer: Self-pay | Admitting: Gastroenterology

## 2020-11-23 ENCOUNTER — Other Ambulatory Visit: Payer: Self-pay

## 2020-11-23 ENCOUNTER — Ambulatory Visit (AMBULATORY_SURGERY_CENTER): Payer: 59 | Admitting: Gastroenterology

## 2020-11-23 ENCOUNTER — Encounter: Payer: Self-pay | Admitting: Gastroenterology

## 2020-11-23 ENCOUNTER — Other Ambulatory Visit: Payer: Self-pay | Admitting: Gastroenterology

## 2020-11-23 VITALS — BP 118/71 | HR 60 | Temp 96.9°F | Resp 10 | Ht 72.0 in | Wt 191.0 lb

## 2020-11-23 DIAGNOSIS — D120 Benign neoplasm of cecum: Secondary | ICD-10-CM | POA: Diagnosis not present

## 2020-11-23 DIAGNOSIS — Z1211 Encounter for screening for malignant neoplasm of colon: Secondary | ICD-10-CM | POA: Diagnosis not present

## 2020-11-23 MED ORDER — SODIUM CHLORIDE 0.9 % IV SOLN: 500.0000 mL | Freq: Once | INTRAVENOUS | Status: DC

## 2020-11-23 NOTE — Progress Notes (Signed)
To pacu, VSS. Report to [email or web address].tb 

## 2020-11-23 NOTE — Progress Notes (Signed)
No problems noted in the recovery room. maw 

## 2020-11-23 NOTE — Op Note (Signed)
[place] Patient Name: Daniel Harding Procedure Date: 11/23/2020 1:33 PM MRN: [id number] Endoscopist: Ladene Artist , MD Age: 65 Referring MD:  Date of Birth: 1956-09-11 Gender: Male Account #: [id number] Procedure:                Colonoscopy Indications:              Screening for colorectal malignant neoplasm Medicines:                Monitored Anesthesia Care Procedure:                Pre-Anesthesia Assessment:                           - Prior to the procedure, a History and Physical                            was performed, and patient medications and                            allergies were reviewed. The patient's tolerance of                            previous anesthesia was also reviewed. The risks                            and benefits of the procedure and the sedation                            options and risks were discussed with the patient.                            All questions were answered, and informed consent                            was obtained. Prior Anticoagulants: The patient has                            taken no previous anticoagulant or antiplatelet                            agents. ASA Grade Assessment: II - A patient with                            mild systemic disease. After reviewing the risks                            and benefits, the patient was deemed in                            satisfactory condition to undergo the procedure.                           After obtaining informed consent, the colonoscope  was passed under direct vision. Throughout the                            procedure, the patient's blood pressure, pulse, and                            oxygen saturations were monitored continuously. The                            Olympus CF-HQ190 [id number]) Colonoscope was                            introduced through the anus and advanced to the the                            cecum, identified by  appendiceal orifice and                            ileocecal valve. The ileocecal valve, appendiceal                            orifice, and rectum were photographed. The quality                            of the bowel preparation was adequate, after                            extensive lavage and suction. The colonoscopy was                            performed without difficulty. The patient tolerated                            the procedure well. Scope In: 1:36:51 PM Scope Out: 2:00:10 PM Scope Withdrawal Time: 0 hours 19 minutes 25 seconds  Total Procedure Duration: 0 hours 23 minutes 19 seconds  Findings:                 The perianal and digital rectal examinations were                            normal.                           A 3 mm polyp was found in the cecum. The polyp was                            sessile. The polyp was removed with a cold biopsy                            forceps. Resection and retrieval were complete.                           Internal hemorrhoids were found during  retroflexion. The hemorrhoids were small and Grade                            I (internal hemorrhoids that do not prolapse).                           The exam was otherwise without abnormality on                            direct and retroflexion views. Complications:            No immediate complications. Estimated blood loss:                            None. Estimated Blood Loss:     Estimated blood loss: none. Impression:               - One 3 mm polyp in the cecum, removed with a cold                            snare. Resected and retrieved.                           - Internal hemorrhoids.                           - The examination was otherwise normal on direct                            and retroflexion views. Recommendation:           - Repeat colonoscopy after studies are complete for                            surveillance based on pathology results with  a more                            extensive bowel prep.                           - Patient has a contact number available for                            emergencies. The signs and symptoms of potential                            delayed complications were discussed with the                            patient. Return to normal activities tomorrow.                            Written discharge instructions were provided to the                            patient.                           -  Resume previous diet.                           - Continue present medications.                           - Await pathology results. Ladene Artist, MD 11/23/2020 2:05:36 PM This report has been signed electronically.

## 2020-11-23 NOTE — Progress Notes (Signed)
Called to room to assist during endoscopic procedure.  Patient ID and intended procedure confirmed with present staff. Received instructions for my participation in the procedure from the performing physician.  

## 2020-11-23 NOTE — Progress Notes (Signed)
Medical history reviewed with no changes noted since PV. VS assessed by J.K, RN

## 2020-11-23 NOTE — Patient Instructions (Addendum)
Handouts were given to you on colon polyp and hemorrhoids. You may resume your current medications today. Await biopsy results.  It may take 1-3 weeks to receive pathology results. Please call if any questions or concerns.     YOU HAD AN ENDOSCOPIC PROCEDURE TODAY AT [facility]:   Refer to the procedure report that was given to you for any specific questions about what was found during the examination.  If the procedure report does not answer your questions, please call your gastroenterologist to clarify.  If you requested that your care partner not be given the details of your procedure findings, then the procedure report has been included in a sealed envelope for you to review at your convenience later.  YOU SHOULD EXPECT: Some feelings of bloating in the abdomen. Passage of more gas than usual.  Walking can help get rid of the air that was put into your GI tract during the procedure and reduce the bloating. If you had a lower endoscopy (such as a colonoscopy or flexible sigmoidoscopy) you may notice spotting of blood in your stool or on the toilet paper. If you underwent a bowel prep for your procedure, you may not have a normal bowel movement for a few days.  Please Note:  You might notice some irritation and congestion in your nose or some drainage.  This is from the oxygen used during your procedure.  There is no need for concern and it should clear up in a day or so.  SYMPTOMS TO REPORT IMMEDIATELY:   Following lower endoscopy (colonoscopy or flexible sigmoidoscopy):  Excessive amounts of blood in the stool  Significant tenderness or worsening of abdominal pains  Swelling of the abdomen that is new, acute  Fever of 100F or higher   For urgent or emergent issues, a gastroenterologist can be reached at any hour by calling [phone]. Do not use MyChart messaging for urgent concerns.    DIET:  We do recommend a small meal at first, but then you may proceed to  your regular diet.  Drink plenty of fluids but you should avoid alcoholic beverages for 24 hours.  ACTIVITY:  You should plan to take it easy for the rest of today and you should NOT DRIVE or use heavy machinery until tomorrow (because of the sedation medicines used during the test).    FOLLOW UP: Our staff will call the number listed on your records 48-72 hours following your procedure to check on you and address any questions or concerns that you may have regarding the information given to you following your procedure. If we do not reach you, we will leave a message.  We will attempt to reach you two times.  During this call, we will ask if you have developed any symptoms of COVID 19. If you develop any symptoms (ie: fever, flu-like symptoms, shortness of breath, cough etc.) before then, please call [phone].  If you test positive for Covid 19 in the 2 weeks post procedure, please call and report this information to [place].    If any biopsies were taken you will be contacted by phone or by letter within the next 1-3 weeks.  Please call us at [phone] if you have not heard about the biopsies in 3 weeks.    SIGNATURES/CONFIDENTIALITY: You and/or your care partner have signed paperwork which will be entered into your electronic medical record.  These signatures attest to the fact that that the information above on your After Visit  Summary has been reviewed and is understood.  Full responsibility of the confidentiality of this discharge information lies with you and/or your care-partner.

## 2020-11-25 ENCOUNTER — Telehealth: Payer: Self-pay

## 2020-11-25 NOTE — Telephone Encounter (Signed)
  Follow up Call-  Call back number 11/23/2020  Post procedure Call Back phone  # [phone]  Permission to leave phone message Yes  Some recent data might be hidden     Patient questions:  Do you have a fever, pain , or abdominal swelling? No. Pain Score  0 *  Have you tolerated food without any problems? Yes.    Have you been able to return to your normal activities? Yes.    Do you have any questions about your discharge instructions: Diet   No. Medications  No. Follow up visit  No.  Do you have questions or concerns about your Care? No.  Actions: * If pain score is 4 or above: No action needed, pain <4. 1. Have you developed a fever since your procedure? no  2.   Have you had an respiratory symptoms (SOB or cough) since your procedure? no  3.   Have you tested positive for COVID 19 since your procedure no  4.   Have you had any family members/close contacts diagnosed with the COVID 19 since your procedure?  no   If yes to any of these questions please route to Joylene John, RN and Joella Prince, RN

## 2020-11-25 NOTE — Telephone Encounter (Signed)
Left message on follow up call. 

## 2020-12-08 ENCOUNTER — Encounter: Payer: Self-pay | Admitting: Gastroenterology

## 2020-12-30 ENCOUNTER — Ambulatory Visit (INDEPENDENT_AMBULATORY_CARE_PROVIDER_SITE_OTHER): Payer: 59 | Admitting: Family Medicine

## 2020-12-30 ENCOUNTER — Encounter: Payer: Self-pay | Admitting: Family Medicine

## 2020-12-30 ENCOUNTER — Other Ambulatory Visit: Payer: Self-pay

## 2020-12-30 VITALS — BP 132/72 | HR 69 | Ht 72.0 in | Wt 192.0 lb

## 2020-12-30 DIAGNOSIS — Z23 Encounter for immunization: Secondary | ICD-10-CM | POA: Diagnosis not present

## 2020-12-30 DIAGNOSIS — Z0000 Encounter for general adult medical examination without abnormal findings: Secondary | ICD-10-CM | POA: Diagnosis not present

## 2020-12-30 LAB — CBC WITH DIFFERENTIAL/PLATELET
Basophils Absolute: 0 10*3/uL (ref 0.0–0.1)
Basophils Relative: 0.7 % (ref 0.0–3.0)
Eosinophils Absolute: 0.1 10*3/uL (ref 0.0–0.7)
Eosinophils Relative: 2.1 % (ref 0.0–5.0)
HCT: 41.3 % (ref 39.0–52.0)
Hemoglobin: 14 g/dL (ref 13.0–17.0)
Lymphocytes Relative: 29.9 % (ref 12.0–46.0)
Lymphs Abs: 1.8 10*3/uL (ref 0.7–4.0)
MCHC: 33.9 g/dL (ref 30.0–36.0)
MCV: 91.8 fl (ref 78.0–100.0)
Monocytes Absolute: 0.4 10*3/uL (ref 0.1–1.0)
Monocytes Relative: 7.7 % (ref 3.0–12.0)
Neutro Abs: 3.5 10*3/uL (ref 1.4–7.7)
Neutrophils Relative %: 59.6 % (ref 43.0–77.0)
Platelets: 213 10*3/uL (ref 150.0–400.0)
RBC: 4.5 Mil/uL (ref 4.22–5.81)
RDW: 12.2 % (ref 11.5–15.5)
WBC: 5.9 10*3/uL (ref 4.0–10.5)

## 2020-12-30 LAB — LIPID PANEL
Cholesterol: 205 mg/dL — ABNORMAL HIGH (ref 0–200)
HDL: 53.1 mg/dL (ref >39.00)
LDL Cholesterol: 124 mg/dL — ABNORMAL HIGH (ref 0–99)
NonHDL: 151.73
Total CHOL/HDL Ratio: 4
Triglycerides: 139 mg/dL (ref 0.0–149.0)
VLDL: 27.8 mg/dL (ref 0.0–40.0)

## 2020-12-30 LAB — BASIC METABOLIC PANEL
BUN: 19 mg/dL (ref 6–23)
CO2: 31 mEq/L (ref 19–32)
Calcium: 9.8 mg/dL (ref 8.4–10.5)
Chloride: 101 mEq/L (ref 96–112)
Creatinine, Ser: 1.01 mg/dL (ref 0.40–1.50)
GFR: 77.69 mL/min (ref >60.00)
Glucose, Bld: 86 mg/dL (ref 70–99)
Potassium: 4.8 mEq/L (ref 3.5–5.1)
Sodium: 137 mEq/L (ref 135–145)

## 2020-12-30 LAB — HEPATIC FUNCTION PANEL
ALT: 22 U/L (ref 0–53)
AST: 25 U/L (ref 0–37)
Albumin: 4.2 g/dL (ref 3.5–5.2)
Alkaline Phosphatase: 83 U/L (ref 39–117)
Bilirubin, Direct: 0.1 mg/dL (ref 0.0–0.3)
Total Bilirubin: 0.4 mg/dL (ref 0.2–1.2)
Total Protein: 6.8 g/dL (ref 6.0–8.3)

## 2020-12-30 LAB — PSA: PSA: 0.54 ng/mL (ref 0.10–4.00)

## 2020-12-30 LAB — TSH: TSH: 2.38 u[IU]/mL (ref 0.35–4.50)

## 2020-12-30 NOTE — Progress Notes (Signed)
Established Patient Office Visit  Subjective:  Patient ID: Daniel Harding, male    DOB: August 13, 1956  Age: 65 y.o. MRN: [id number]  CC:  Chief Complaint  Patient presents with  . Annual Exam    HPI Daniel Harding presents for complete physical.  He has hypertension history and takes a half of the lisinopril HCTZ 10/12.5 mg daily.  Blood pressure very well controlled by home readings.  His job requires a lot of walking usually 10-15,000 steps per day and he also does some regular exercise.  Generally feels well.  Health maintenance reviewed  -COVID vaccines up-to-date -Needs Prevnar 13 -Has received previous Shingrix -Tetanus due 2024 -No history of hepatitis C screening No history of abdominal aortic aneurysm screening.  Prior history of smoking. -Colonoscopy was just done couple months ago with 7-year follow-up recommended  Social history-married.  His wife has had recent breast cancer.  She works with [employer] and nursing.  He works as a Airline pilot.  Smoked about 10 years back in the 42s.  Quit 1982.  No regular alcohol use.  He has 3 children from first marriage and 3 stepchildren.  3 grandchildren.  Family history-mother is 30 with glaucoma history but otherwise well.  Father died of multiple myeloma complications.  He has a sister who is alive and well.  No family history of premature CAD  The 10-year ASCVD risk score [name]., et al., 2013) is: 16.9%   Values used to calculate the score:     Age: 32 years     Sex: Male     Is Non-Hispanic African American: No     Diabetic: No     Tobacco smoker: No     Systolic Blood Pressure: 295 mmHg     Is BP treated: Yes     HDL Cholesterol: 49.1 mg/dL     Total Cholesterol: 201 mg/dL      Past Medical History:  Diagnosis Date  . Allergy    Dog and cat dander  . Heart murmur    as a child  . Hypertension     Past Surgical History:  Procedure Laterality Date  . COLONOSCOPY  2008   MS  .  KNEE ARTHROSCOPY WITH ANTERIOR CRUCIATE LIGAMENT (ACL) REPAIR Left 2006  . septal deviation      Family History  Problem Relation Age of Onset  . Hypertension Father   . Cancer Father 55       multiple myeloma  . Colon cancer Neg Hx   . Colon polyps Neg Hx   . Esophageal cancer Neg Hx   . Rectal cancer Neg Hx   . Stomach cancer Neg Hx     Social History   Socioeconomic History  . Marital status: Married    Spouse name: Not on file  . Number of children: Not on file  . Years of education: Not on file  . Highest education level: Not on file  Occupational History  . Not on file  Tobacco Use  . Smoking status: Former Smoker    Packs/day: 0.25    Years: 10.00    Pack years: 2.50    Types: Cigarettes    Quit date: 1982    Years since quitting: 40.1  . Smokeless tobacco: Never Used  . Tobacco comment: 4-5 cigarettes/ day only for 10 yrs  Vaping Use  . Vaping Use: Never used  Substance and Sexual Activity  . Alcohol use: Yes  Alcohol/week: 1.0 standard drink    Types: 1 Standard drinks or equivalent per week  . Drug use: Never  . Sexual activity: Not on file  Other Topics Concern  . Not on file  Social History Narrative  . Not on file   Social Determinants of Health   Financial Resource Strain: Not on file  Food Insecurity: Not on file  Transportation Needs: Not on file  Physical Activity: Not on file  Stress: Not on file  Social Connections: Not on file  Intimate Partner Violence: Not on file    Outpatient Medications Prior to Visit  Medication Sig Dispense Refill  . lisinopril-hydrochlorothiazide (ZESTORETIC) 10-12.5 MG tablet Take 0.5 tablets by mouth daily.    Marland Kitchen aspirin 81 MG tablet Take 81 mg by mouth daily.    . multivitamin (THERAGRAN) per tablet Take 1 tablet by mouth daily.    Marland Kitchen albuterol (PROVENTIL HFA;VENTOLIN HFA) 108 (90 Base) MCG/ACT inhaler Inhale 2 puffs into the lungs every 4 (four) hours as needed for wheezing or shortness of breath.  (Patient not taking: Reported on 11/23/2020) 1 Inhaler 0  . lisinopril-hydrochlorothiazide (ZESTORETIC) 10-12.5 MG tablet Take 1 tablet by mouth daily. 90 tablet 1   No facility-administered medications prior to visit.    No Known Allergies  ROS Review of Systems  Constitutional: Negative for activity change, appetite change, fatigue and fever.  HENT: Negative for congestion, ear pain and trouble swallowing.   Eyes: Negative for pain and visual disturbance.  Respiratory: Negative for cough, shortness of breath and wheezing.   Cardiovascular: Negative for chest pain and palpitations.  Gastrointestinal: Negative for abdominal distention, abdominal pain, blood in stool, constipation, diarrhea, nausea, rectal pain and vomiting.  Genitourinary: Negative for dysuria, hematuria and testicular pain.  Musculoskeletal: Negative for arthralgias and joint swelling.  Skin: Negative for rash.  Neurological: Negative for dizziness, syncope and headaches.  Hematological: Negative for adenopathy.  Psychiatric/Behavioral: Negative for confusion and dysphoric mood.      Objective:    Physical Exam Constitutional:      General: He is not in acute distress.    Appearance: He is well-developed and well-nourished.  HENT:     Head: Normocephalic and atraumatic.     Left Ear: External ear normal.     Ears:     Comments: He has cerumen right canal which obstructs view of eardrum    Mouth/Throat:     Mouth: Oropharynx is clear and moist.  Eyes:     Extraocular Movements: EOM normal.     Conjunctiva/sclera: Conjunctivae normal.     Pupils: Pupils are equal, round, and reactive to light.  Neck:     Thyroid: No thyromegaly.  Cardiovascular:     Rate and Rhythm: Normal rate and regular rhythm.     Heart sounds: Normal heart sounds. No murmur heard.   Pulmonary:     Effort: No respiratory distress.     Breath sounds: No wheezing or rales.  Abdominal:     General: Bowel sounds are normal. There is  no distension.     Palpations: Abdomen is soft. There is no mass.     Tenderness: There is no abdominal tenderness. There is no guarding or rebound.  Musculoskeletal:        General: No edema.     Cervical back: Normal range of motion and neck supple.  Lymphadenopathy:     Cervical: No cervical adenopathy.  Skin:    Findings: No rash.  Neurological:  Mental Status: He is alert and oriented to person, place, and time.     Cranial Nerves: No cranial nerve deficit.     Deep Tendon Reflexes: Reflexes normal.  Psychiatric:        Mood and Affect: Mood and affect normal.     BP (!) 158/80   Pulse 69   Ht 6' (1.829 m)   Wt 192 lb (87.1 kg)   SpO2 97%   BMI 26.04 kg/m  Wt Readings from Last 3 Encounters:  12/30/20 192 lb (87.1 kg)  11/23/20 191 lb (86.6 kg)  11/09/20 191 lb (86.6 kg)     Health Maintenance Due  Topic Date Due  . Hepatitis C Screening  Never done  . PNA vac Low Risk Adult (1 of 2 - PCV13) Never done  . COVID-19 Vaccine (3 - Booster for Moderna series) 07/27/2020    There are no preventive care reminders to display for this patient.  Lab Results  Component Value Date   TSH 2.71 07/23/2018   Lab Results  Component Value Date   WBC 5.4 07/23/2018   HGB 14.4 07/23/2018   HCT 41.9 07/23/2018   MCV 89.9 07/23/2018   PLT 204.0 07/23/2018   Lab Results  Component Value Date   NA 137 07/23/2018   K 5.0 07/23/2018   CO2 31 07/23/2018   GLUCOSE 93 07/23/2018   BUN 19 07/23/2018   CREATININE 0.98 07/23/2018   BILITOT 0.5 07/23/2018   ALKPHOS 85 07/23/2018   AST 19 07/23/2018   ALT 16 07/23/2018   PROT 7.1 07/23/2018   ALBUMIN 4.4 07/23/2018   CALCIUM 9.8 07/23/2018   GFR 81.91 07/23/2018   Lab Results  Component Value Date   CHOL 201 (H) 07/23/2018   Lab Results  Component Value Date   HDL 49.10 07/23/2018   Lab Results  Component Value Date   LDLCALC 132 (H) 07/23/2018   Lab Results  Component Value Date   TRIG 97.0 07/23/2018    Lab Results  Component Value Date   CHOLHDL 4 07/23/2018   No results found for: HGBA1C    Assessment & Plan:   Problem List Items Addressed This Visit   None   Visit Diagnoses    Physical exam    -  Primary   Relevant Orders   Basic metabolic panel   Lipid panel   CBC with Differential/Platelet   TSH   Hepatic function panel   PSA   Hep C Antibody    Generally healthy 65 year old male.  Hypertension which is well controlled.  We discussed several health maintenance issues as follows  -Obtain screening labs as above.  Include hepatitis C antibody and PSA screening -Recommend Prevnar 13 today and Pneumovax in 1 year -Set up 1 time abdominal aortic aneurysm screen for males 65-75 with history of smoking -Continue annual flu vaccine -Other vaccines up-to-date -Continue regular exercise habits -He does not qualify for low-dose lung cancer screening  No orders of the defined types were placed in this encounter.   Follow-up: No follow-ups on file.    Carolann Littler, MD

## 2020-12-30 NOTE — Patient Instructions (Signed)
Preventive Care 65 Years and Older, Male Preventive care refers to lifestyle choices and visits with your health care provider that can promote health and wellness. This includes:  A yearly physical exam. This is also called an annual wellness visit.  Regular dental and eye exams.  Immunizations.  Screening for certain conditions.  Healthy lifestyle choices, such as: ? Eating a healthy diet. ? Getting regular exercise. ? Not using drugs or products that contain nicotine and tobacco. ? Limiting alcohol use. What can I expect for my preventive care visit? Physical exam Your health care provider will check your:  Height and weight. These may be used to calculate your BMI (body mass index). BMI is a measurement that tells if you are at a healthy weight.  Heart rate and blood pressure.  Body temperature.  Skin for abnormal spots. Counseling Your health care provider may ask you questions about your:  Past medical problems.  Family's medical history.  Alcohol, tobacco, and drug use.  Emotional well-being.  Home life and relationship well-being.  Sexual activity.  Diet, exercise, and sleep habits.  History of falls.  Memory and ability to understand (cognition).  Work and work environment.  Access to firearms. What immunizations do I need? Vaccines are usually given at various ages, according to a schedule. Your health care provider will recommend vaccines for you based on your age, medical history, and lifestyle or other factors, such as travel or where you work.   What tests do I need? Blood tests  Lipid and cholesterol levels. These may be checked every 5 years, or more often depending on your overall health.  Hepatitis C test.  Hepatitis B test. Screening  Lung cancer screening. You may have this screening every year starting at age 55 if you have a 30-pack-year history of smoking and currently smoke or have quit within the past 15 years.  Colorectal  cancer screening. ? All adults should have this screening starting at age 50 and continuing until age 75. ? Your health care provider may recommend screening at age 45 if you are at increased risk. ? You will have tests every 1-10 years, depending on your results and the type of screening test.  Prostate cancer screening. Recommendations will vary depending on your family history and other risks.  Genital exam to check for testicular cancer or hernias.  Diabetes screening. ? This is done by checking your blood sugar (glucose) after you have not eaten for a while (fasting). ? You may have this done every 1-3 years.  Abdominal aortic aneurysm (AAA) screening. You may need this if you are a current or former smoker.  STD (sexually transmitted disease) testing, if you are at risk. Follow these instructions at home: Eating and drinking  Eat a diet that includes fresh fruits and vegetables, whole grains, lean protein, and low-fat dairy products. Limit your intake of foods with high amounts of sugar, saturated fats, and salt.  Take vitamin and mineral supplements as recommended by your health care provider.  Do not drink alcohol if your health care provider tells you not to drink.  If you drink alcohol: ? Limit how much you have to 0-2 drinks a day. ? Be aware of how much alcohol is in your drink. In the [place] one drink equals one 12 oz bottle of beer (355 mL), one 5 oz glass of wine (148 mL), or one 1 oz glass of hard liquor (44 mL).   Lifestyle  Take daily care of your teeth   and gums. Brush your teeth every morning and night with fluoride toothpaste. Floss one time each day.  Stay active. Exercise for at least 30 minutes 5 or more days each week.  Do not use any products that contain nicotine or tobacco, such as cigarettes, e-cigarettes, and chewing tobacco. If you need help quitting, ask your health care provider.  Do not use drugs.  If you are sexually active, practice safe sex.  Use a condom or other form of protection to prevent STIs (sexually transmitted infections).  Talk with your health care provider about taking a low-dose aspirin or statin.  Find healthy ways to cope with stress, such as: ? Meditation, yoga, or listening to music. ? Journaling. ? Talking to a trusted person. ? Spending time with friends and family. Safety  Always wear your seat belt while driving or riding in a vehicle.  Do not drive: ? If you have been drinking alcohol. Do not ride with someone who has been drinking. ? When you are tired or distracted. ? While texting.  Wear a helmet and other protective equipment during sports activities.  If you have firearms in your house, make sure you follow all gun safety procedures. What's next?  Visit your health care provider once a year for an annual wellness visit.  Ask your health care provider how often you should have your eyes and teeth checked.  Stay up to date on all vaccines. This information is not intended to replace advice given to you by your health care provider. Make sure you discuss any questions you have with your health care provider. Document Revised: 07/16/2019 Document Reviewed: 10/11/2018 Elsevier Patient Education  2021 [employer]  

## 2020-12-31 LAB — HEPATITIS C ANTIBODY
Hepatitis C Ab: NONREACTIVE
SIGNAL TO CUT-OFF: 0.03 (ref <1.00)

## 2021-01-13 DIAGNOSIS — L570 Actinic keratosis: Secondary | ICD-10-CM | POA: Diagnosis not present

## 2021-01-26 ENCOUNTER — Ambulatory Visit
Admission: RE | Admit: 2021-01-26 | Discharge: 2021-01-26 | Disposition: A | Discharge status: Home / Self Care | Payer: 59 | Source: Ambulatory Visit | Attending: Family Medicine | Admitting: Family Medicine

## 2021-01-26 DIAGNOSIS — Z87891 Personal history of nicotine dependence: Secondary | ICD-10-CM | POA: Diagnosis not present

## 2021-01-26 DIAGNOSIS — Z136 Encounter for screening for cardiovascular disorders: Secondary | ICD-10-CM | POA: Diagnosis not present

## 2021-01-26 DIAGNOSIS — Z0000 Encounter for general adult medical examination without abnormal findings: Secondary | ICD-10-CM

## 2021-05-28 DIAGNOSIS — H6123 Impacted cerumen, bilateral: Secondary | ICD-10-CM | POA: Diagnosis not present

## 2021-06-23 DIAGNOSIS — H903 Sensorineural hearing loss, bilateral: Secondary | ICD-10-CM | POA: Diagnosis not present

## 2021-07-21 ENCOUNTER — Other Ambulatory Visit: Payer: Self-pay | Admitting: Family Medicine

## 2021-08-02 DIAGNOSIS — H903 Sensorineural hearing loss, bilateral: Secondary | ICD-10-CM | POA: Diagnosis not present

## 2021-09-28 DIAGNOSIS — H59813 Chorioretinal scars after surgery for detachment, bilateral: Secondary | ICD-10-CM | POA: Diagnosis not present

## 2021-09-28 DIAGNOSIS — H52213 Irregular astigmatism, bilateral: Secondary | ICD-10-CM | POA: Diagnosis not present

## 2021-09-28 DIAGNOSIS — H524 Presbyopia: Secondary | ICD-10-CM | POA: Diagnosis not present

## 2021-11-10 DIAGNOSIS — L570 Actinic keratosis: Secondary | ICD-10-CM | POA: Diagnosis not present

## 2021-11-10 DIAGNOSIS — D225 Melanocytic nevi of trunk: Secondary | ICD-10-CM | POA: Diagnosis not present

## 2021-11-10 DIAGNOSIS — L821 Other seborrheic keratosis: Secondary | ICD-10-CM | POA: Diagnosis not present

## 2021-11-10 DIAGNOSIS — L820 Inflamed seborrheic keratosis: Secondary | ICD-10-CM | POA: Diagnosis not present

## 2022-09-27 ENCOUNTER — Other Ambulatory Visit: Payer: Self-pay | Admitting: Family Medicine

## 2022-09-29 DIAGNOSIS — H52213 Irregular astigmatism, bilateral: Secondary | ICD-10-CM | POA: Diagnosis not present

## 2022-09-29 DIAGNOSIS — H524 Presbyopia: Secondary | ICD-10-CM | POA: Diagnosis not present

## 2022-09-29 DIAGNOSIS — H59813 Chorioretinal scars after surgery for detachment, bilateral: Secondary | ICD-10-CM | POA: Diagnosis not present

## 2022-10-06 IMAGING — US US ABDOMINAL AORTA SCREENING AAA
1 series · 14 of 25 positions shown · non-contrast
Comparison: MRI lumbar spine dated September 25, 2015

CLINICAL DATA: Male between 65-75 years of age with a smoking
history.

EXAM:
US ABDOMINAL AORTA MEDICARE SCREENING
TECHNIQUE: Ultrasound examination of the abdominal aorta was performed as a
screening evaluation for abdominal aortic aneurysm.

[Series 1: us abdominal aorta screening aaa · 0.30mm/px · 14 of 32 slices shown]
[im 1/32]
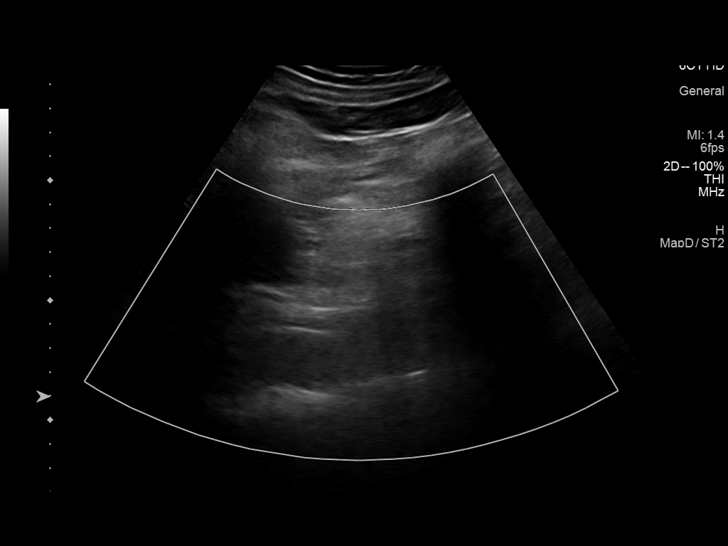
[im 3/32]
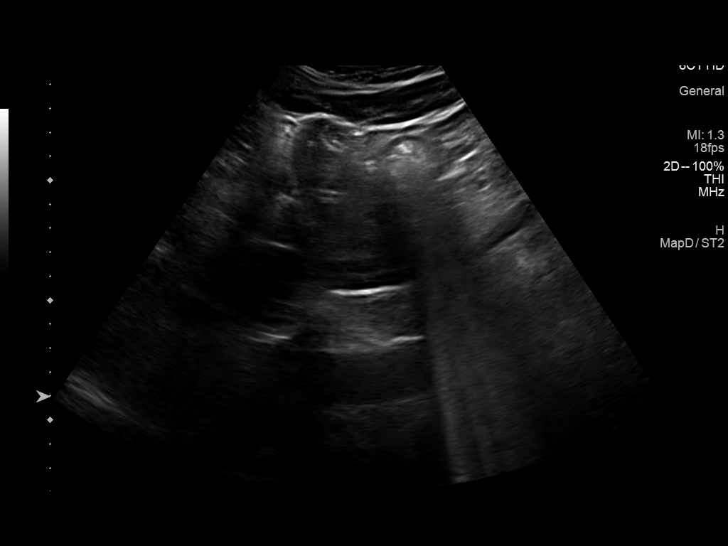
[im 6/32]
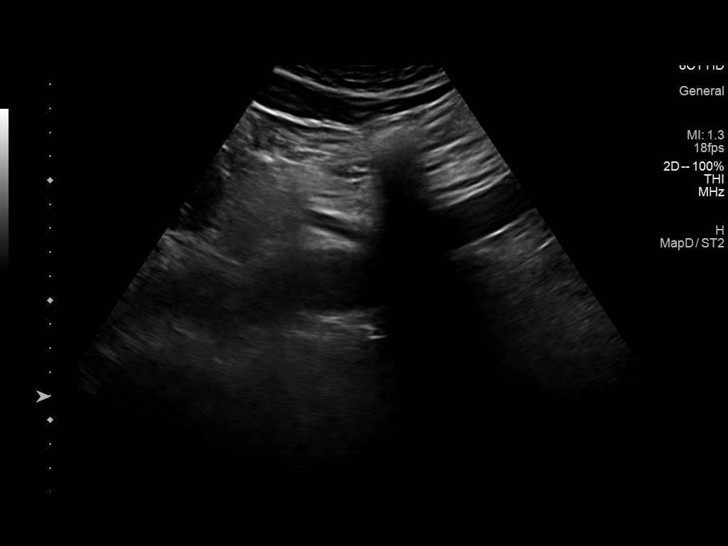
[im 8/32]
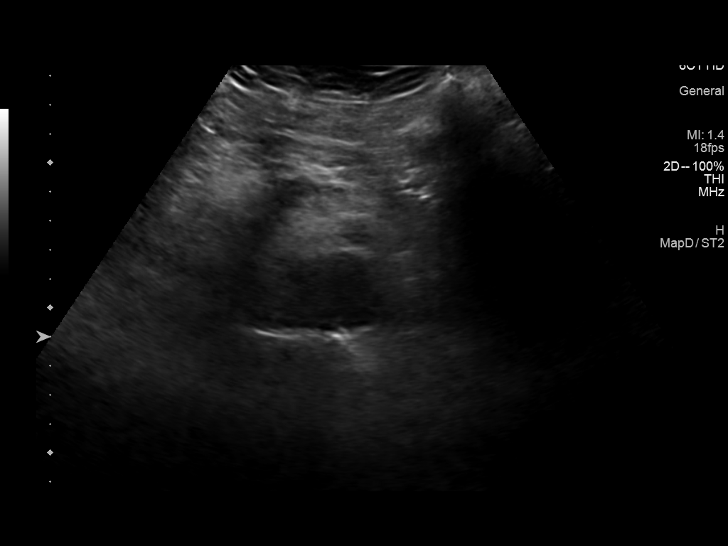
[im 11/32]
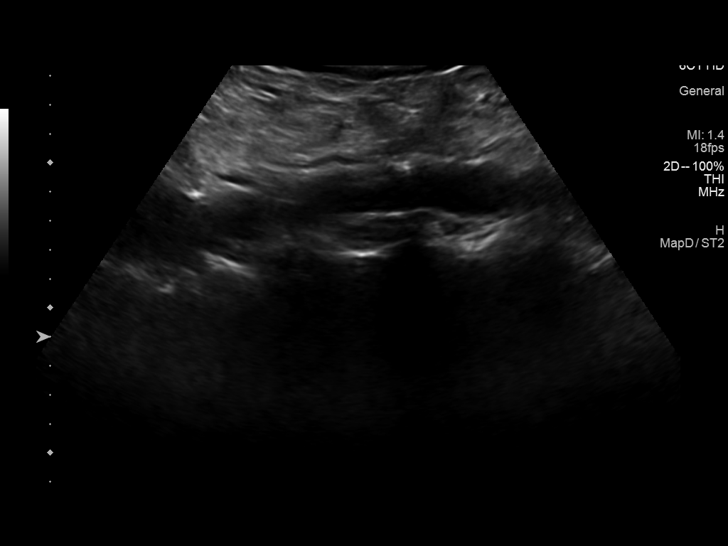
[im 12/32]
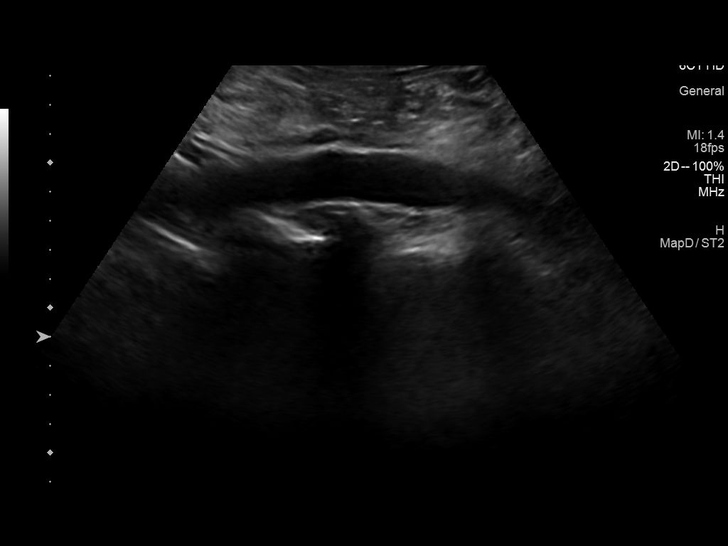
[im 15/32]
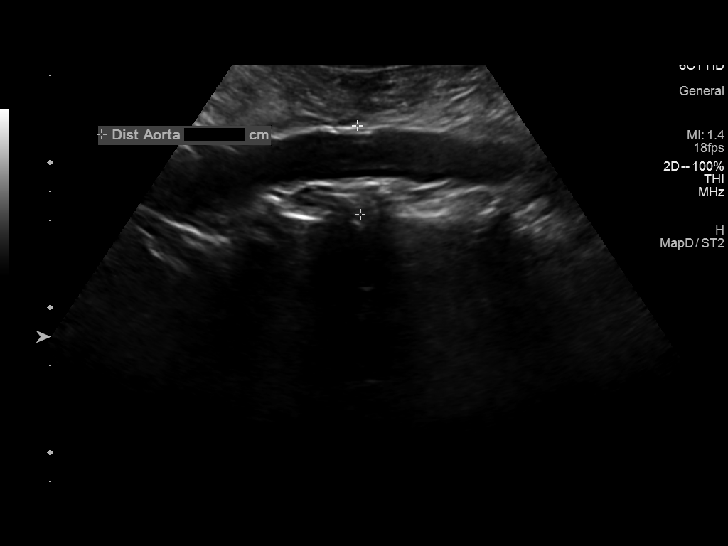
[im 17/32]
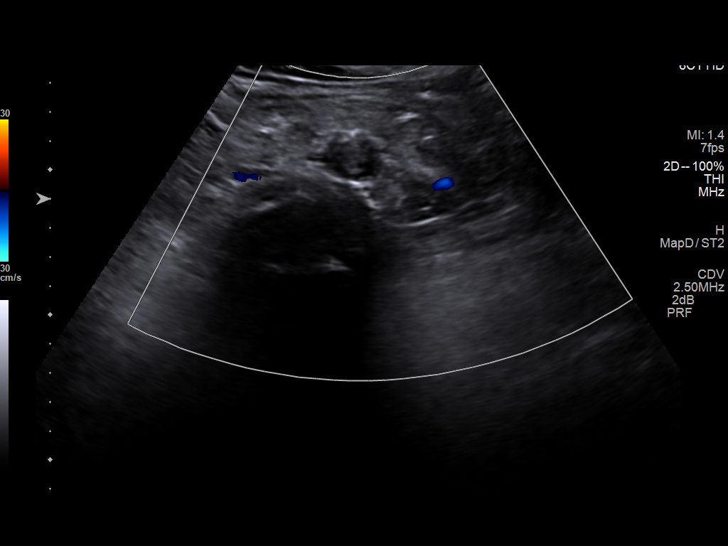
[im 20/32]
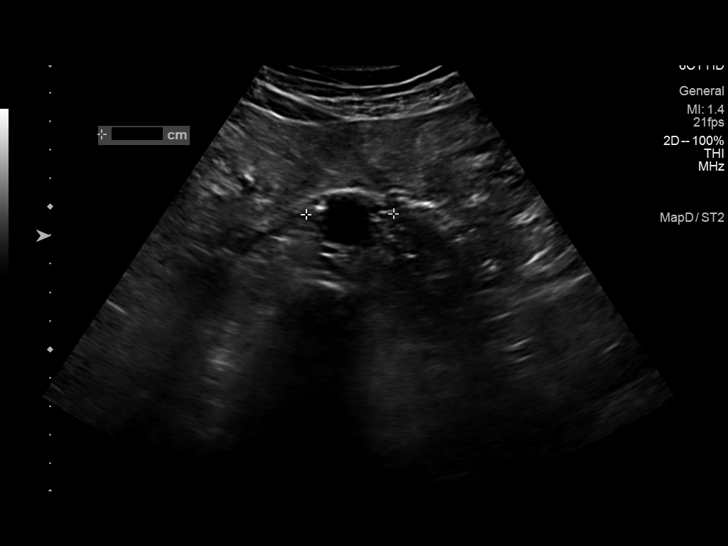
[im 21/32]
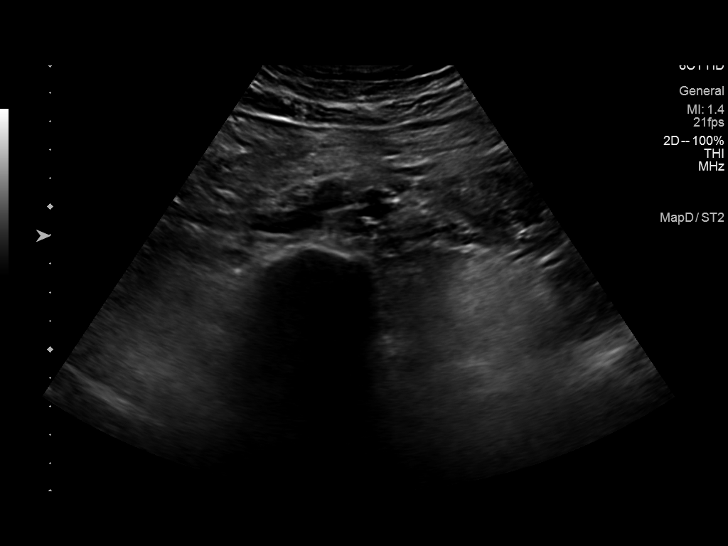
[im 24/32]
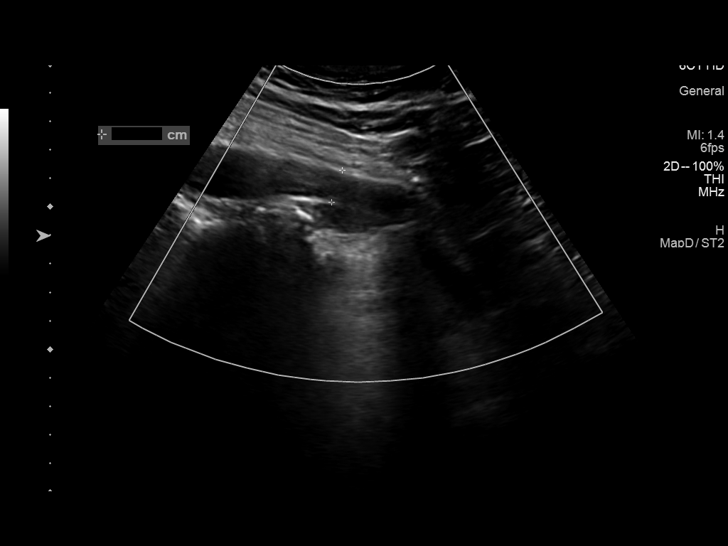
[im 26/32]
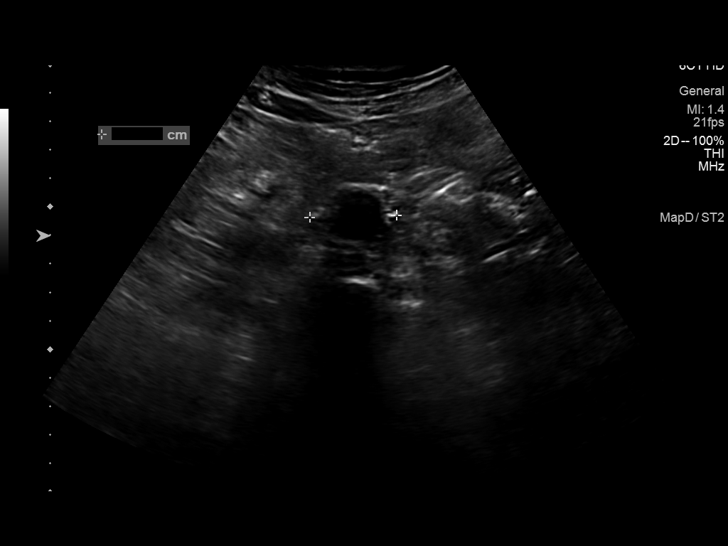
[im 29/32]
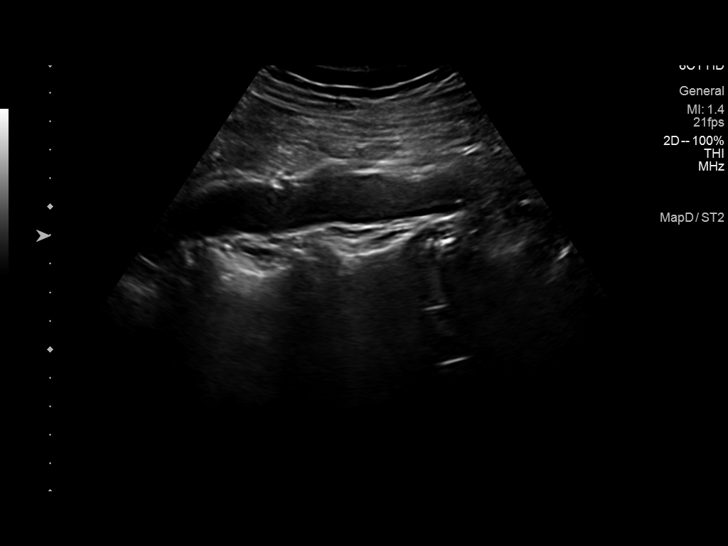
[im 32/32]
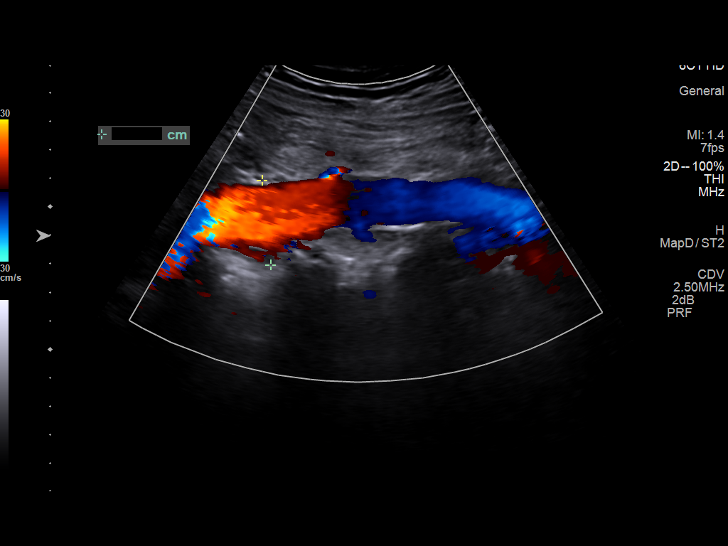

[14 of 25 positions shown; findings below may reference images not displayed]

FINDINGS: Abdominal aortic measurements as follows:

Proximal:  2.9 x 2.9 cm

Mid:  3 x 3 cm

Distal:  3.1 x 3.1 cm

Atherosclerotic changes are noted.
IMPRESSION: 1. Exam limited by patient body habitus and suboptimal sonographic
windows in addition to atherosclerotic changes of the abdominal
aorta.
2. There is a questionable infrarenal abdominal aortic aneurysm
measuring approximately 3 cm. Recommend follow-up ultrasound every 3
years. This recommendation follows ACR consensus guidelines: White
Paper of the ACR Incidental Findings Committee II on Vascular
Findings. [HOSPITAL] 5858; [DATE].

## 2022-11-22 DIAGNOSIS — L570 Actinic keratosis: Secondary | ICD-10-CM | POA: Diagnosis not present

## 2022-11-22 DIAGNOSIS — D2262 Melanocytic nevi of left upper limb, including shoulder: Secondary | ICD-10-CM | POA: Diagnosis not present

## 2022-11-22 DIAGNOSIS — D225 Melanocytic nevi of trunk: Secondary | ICD-10-CM | POA: Diagnosis not present

## 2022-11-22 DIAGNOSIS — L821 Other seborrheic keratosis: Secondary | ICD-10-CM | POA: Diagnosis not present

## 2022-11-22 DIAGNOSIS — D2261 Melanocytic nevi of right upper limb, including shoulder: Secondary | ICD-10-CM | POA: Diagnosis not present

## 2022-11-22 DIAGNOSIS — D1801 Hemangioma of skin and subcutaneous tissue: Secondary | ICD-10-CM | POA: Diagnosis not present

## 2022-12-06 ENCOUNTER — Encounter: Payer: Self-pay | Admitting: Family Medicine

## 2022-12-06 ENCOUNTER — Ambulatory Visit (INDEPENDENT_AMBULATORY_CARE_PROVIDER_SITE_OTHER): Payer: Commercial Managed Care - PPO | Admitting: Family Medicine

## 2022-12-06 VITALS — BP 138/78 | HR 70 | Temp 96.5°F | Ht 72.44 in | Wt 195.3 lb

## 2022-12-06 DIAGNOSIS — Z0000 Encounter for general adult medical examination without abnormal findings: Secondary | ICD-10-CM

## 2022-12-06 DIAGNOSIS — I7143 Infrarenal abdominal aortic aneurysm, without rupture: Secondary | ICD-10-CM | POA: Diagnosis not present

## 2022-12-06 LAB — CBC WITH DIFFERENTIAL/PLATELET
Basophils Absolute: 0.1 10*3/uL (ref 0.0–0.1)
Basophils Relative: 1 % (ref 0.0–3.0)
Eosinophils Absolute: 0.1 10*3/uL (ref 0.0–0.7)
Eosinophils Relative: 2.5 % (ref 0.0–5.0)
HCT: 41.8 % (ref 39.0–52.0)
Hemoglobin: 14.4 g/dL (ref 13.0–17.0)
Lymphocytes Relative: 32.6 % (ref 12.0–46.0)
Lymphs Abs: 2 10*3/uL (ref 0.7–4.0)
MCHC: 34.4 g/dL (ref 30.0–36.0)
MCV: 91.5 fl (ref 78.0–100.0)
Monocytes Absolute: 0.4 10*3/uL (ref 0.1–1.0)
Monocytes Relative: 6 % (ref 3.0–12.0)
Neutro Abs: 3.5 10*3/uL (ref 1.4–7.7)
Neutrophils Relative %: 57.9 % (ref 43.0–77.0)
Platelets: 266 10*3/uL (ref 150.0–400.0)
RBC: 4.57 Mil/uL (ref 4.22–5.81)
RDW: 12.5 % (ref 11.5–15.5)
WBC: 6 10*3/uL (ref 4.0–10.5)

## 2022-12-06 LAB — LIPID PANEL
Cholesterol: 223 mg/dL — ABNORMAL HIGH (ref 0–200)
HDL: 54.2 mg/dL (ref >39.00)
LDL Cholesterol: 145 mg/dL — ABNORMAL HIGH (ref 0–99)
NonHDL: 168.78
Total CHOL/HDL Ratio: 4
Triglycerides: 119 mg/dL (ref 0.0–149.0)
VLDL: 23.8 mg/dL (ref 0.0–40.0)

## 2022-12-06 LAB — BASIC METABOLIC PANEL
BUN: 16 mg/dL (ref 6–23)
CO2: 25 mEq/L (ref 19–32)
Calcium: 9.4 mg/dL (ref 8.4–10.5)
Chloride: 101 mEq/L (ref 96–112)
Creatinine, Ser: 0.94 mg/dL (ref 0.40–1.50)
GFR: 83.54 mL/min (ref >60.00)
Glucose, Bld: 96 mg/dL (ref 70–99)
Potassium: 4.4 mEq/L (ref 3.5–5.1)
Sodium: 138 mEq/L (ref 135–145)

## 2022-12-06 LAB — HEPATIC FUNCTION PANEL
ALT: 22 U/L (ref 0–53)
AST: 26 U/L (ref 0–37)
Albumin: 4.6 g/dL (ref 3.5–5.2)
Alkaline Phosphatase: 79 U/L (ref 39–117)
Bilirubin, Direct: 0.1 mg/dL (ref 0.0–0.3)
Total Bilirubin: 0.5 mg/dL (ref 0.2–1.2)
Total Protein: 7.1 g/dL (ref 6.0–8.3)

## 2022-12-06 LAB — PSA: PSA: 0.58 ng/mL (ref 0.10–4.00)

## 2022-12-06 LAB — TSH: TSH: 2.67 u[IU]/mL (ref 0.35–5.50)

## 2022-12-06 MED ORDER — LISINOPRIL-HYDROCHLOROTHIAZIDE 10-12.5 MG PO TABS
1.0000 | ORAL_TABLET | Freq: Every day | ORAL | 3 refills | Status: DC
Start: 2022-12-06 — End: 2023-01-13

## 2022-12-06 NOTE — Progress Notes (Signed)
Established Patient Office Visit  Subjective   Patient ID: Daniel Harding, male    DOB: [birth year]  Age: 67 y.o. MRN: [id number]  No chief complaint on file.   HPI   Daniel Harding is seen for physical exam.  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  Generally doing well.  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  Walks about 10,000 steps most days with his work.  Takes lisinopril HCTZ 10/12.5 mg 1/2 tablet daily for hypertension.  Health maintenance reviewed  -Had previous Prevnar 13 but not Pneumovax.  He declines today. -Flu vaccine already given -Colonoscopy due 2029 -Tetanus due later this year -He recalls having both components of Shingrix. -Previous hepatitis C screen negative  Social history-married.  His wife has had recent breast cancer.  She works with [employer] and nursing.  He works as a Airline pilot.  Smoked about 10 years back in the 60s.  Quit 1982.  No regular alcohol use.  He has 3 children from first marriage and 3 stepchildren.  3 grandchildren.   Family history-mother is 95 with glaucoma history but otherwise well.  Father died of multiple myeloma complications.  He has a sister who is alive and well.  No family history of premature CAD  Past Medical History:  Diagnosis Date   Allergy    Dog and cat dander   Heart murmur    as a child   Hypertension    Past Surgical History:  Procedure Laterality Date   COLONOSCOPY  2008   MS   KNEE ARTHROSCOPY WITH ANTERIOR CRUCIATE LIGAMENT (ACL) REPAIR Left 2006   septal deviation      reports that he quit smoking about 42 years ago. His smoking use included cigarettes. He has a 2.50 pack-year smoking history. He has never used smokeless tobacco. He reports current alcohol use of about 1.0 standard drink of alcohol per week. He reports that he does not use drugs. family history includes Cancer (age of onset: 60) in his father; Hypertension in his father. No Known  Allergies  Review of Systems  Constitutional:  Negative for chills, fever, malaise/fatigue and weight loss.  HENT:  Negative for hearing loss.   Eyes:  Negative for blurred vision and double vision.  Respiratory:  Negative for cough and shortness of breath.   Cardiovascular:  Negative for chest pain, palpitations and leg swelling.  Gastrointestinal:  Negative for abdominal pain, blood in stool, constipation and diarrhea.  Genitourinary:  Negative for dysuria.  Skin:  Negative for rash.  Neurological:  Negative for dizziness, speech change, seizures, loss of consciousness and headaches.  Psychiatric/Behavioral:  Negative for depression.       Objective:     BP 138/78 (BP Location: Left Arm, Cuff Size: Normal)   Pulse 70   Temp (!) 96.5 F (35.8 C) (Axillary)   Ht 6' 0.44" (1.84 m)   Wt 195 lb 4.8 oz (88.6 kg)   SpO2 99%   BMI 26.17 kg/m    Physical Exam Vitals reviewed.  Constitutional:      General: He is not in acute distress.    Appearance: He is well-developed.  HENT:     Head: Normocephalic and atraumatic.     Right Ear: External ear normal.     Left Ear: External ear normal.  Eyes:     Conjunctiva/sclera: Conjunctivae normal.     Pupils: Pupils are equal, round, and reactive to light.  Neck:  Thyroid: No thyromegaly.  Cardiovascular:     Rate and Rhythm: Normal rate and regular rhythm.     Heart sounds: Normal heart sounds. No murmur heard. Pulmonary:     Effort: No respiratory distress.     Breath sounds: No wheezing or rales.  Abdominal:     General: Bowel sounds are normal. There is no distension.     Palpations: Abdomen is soft. There is no mass.     Tenderness: There is no abdominal tenderness. There is no guarding or rebound.  Musculoskeletal:     Cervical back: Normal range of motion and neck supple.  Lymphadenopathy:     Cervical: No cervical adenopathy.  Skin:    Findings: No rash.  Neurological:     Mental Status: He is alert and oriented  to person, place, and time.     Cranial Nerves: No cranial nerve deficit.      No results found for any visits on 12/06/22.    The 10-year ASCVD risk score (Arnett DK, et al., 2019) is: 20.3%    Assessment & Plan:   Problem List Items Addressed This Visit   None Visit Diagnoses     Physical exam    -  Primary   Relevant Orders   Basic metabolic panel   Lipid panel   CBC with Differential/Platelet   TSH   Hepatic function panel   PSA     -History of hypertension which is generally well-controlled at home readings.  He feels like he has a small component of whitecoat syndrome.  Blood pressure did improve slightly after rest.  -We did recommend he consider Prevnar 20 or Pneumovax to complete pneumonia vaccine series.  He will consider  -We discussed possible coronary calcium score for further restratification he will consider  -Need repeat abdominal aneurysm surveillance next year.  He had 3 cm infrarenal aortic aneurysm 2 years ago  No follow-ups on file.    Carolann Littler, MD

## 2022-12-06 NOTE — Patient Instructions (Signed)
Consider pneumovax or Prevnar 20 at some point this year.    This will complete your pneumonia vaccinations  We need to remember to get repeat aneurysm screen next year (abdominal)

## 2022-12-20 ENCOUNTER — Encounter: Payer: Self-pay | Admitting: Family Medicine

## 2022-12-20 DIAGNOSIS — E7800 Pure hypercholesterolemia, unspecified: Secondary | ICD-10-CM

## 2022-12-20 MED ORDER — ATORVASTATIN CALCIUM 20 MG PO TABS
20.0000 mg | ORAL_TABLET | Freq: Every day | ORAL | 1 refills | Status: DC
  Filled 2023-01-24 (×2): qty 90, 90d supply, fill #0
  Filled 2023-08-03: qty 90, 90d supply, fill #1

## 2023-01-13 ENCOUNTER — Other Ambulatory Visit: Payer: Self-pay | Admitting: Family Medicine

## 2023-01-23 ENCOUNTER — Other Ambulatory Visit (HOSPITAL_COMMUNITY): Payer: Self-pay

## 2023-01-24 ENCOUNTER — Other Ambulatory Visit (HOSPITAL_COMMUNITY): Payer: Self-pay

## 2023-01-31 ENCOUNTER — Encounter: Payer: Self-pay | Admitting: Family Medicine

## 2023-04-26 DIAGNOSIS — M25561 Pain in right knee: Secondary | ICD-10-CM | POA: Diagnosis not present

## 2023-05-24 DIAGNOSIS — M25561 Pain in right knee: Secondary | ICD-10-CM | POA: Diagnosis not present

## 2023-06-23 ENCOUNTER — Other Ambulatory Visit (HOSPITAL_COMMUNITY): Payer: Self-pay

## 2023-06-27 ENCOUNTER — Telehealth: Payer: Self-pay | Admitting: Family Medicine

## 2023-06-27 ENCOUNTER — Telehealth: Payer: Commercial Managed Care - PPO | Admitting: Nurse Practitioner

## 2023-06-27 DIAGNOSIS — M545 Low back pain, unspecified: Secondary | ICD-10-CM

## 2023-06-27 MED ORDER — CYCLOBENZAPRINE HCL 10 MG PO TABS: 10.0000 mg | ORAL_TABLET | Freq: Three times a day (TID) | ORAL | 0 refills | Status: DC | PRN

## 2023-06-27 MED ORDER — METHYLPREDNISOLONE 4 MG PO TBPK: ORAL_TABLET | ORAL | 0 refills | Status: DC

## 2023-06-27 NOTE — Telephone Encounter (Signed)
Pt just had a [place] telehealth video visit with Lavone Neri, FNP for lumbar back pain.  Pt states that he was prescribed flexeril, as well as some other pain meds.   Pt says he is in so much pain, he can barely move and/or use the bathroom.  Pt is asking if MD thinks this will be enough or should he be taking something stronger, or what else can he do?

## 2023-06-27 NOTE — Progress Notes (Signed)
Virtual Visit Consent   Daniel Harding, you are scheduled for a virtual visit with a [facility] provider today. Just as with appointments in the office, your consent must be obtained to participate. Your consent will be active for this visit and any virtual visit you may have with one of our providers in the next 365 days. If you have a MyChart account, a copy of this consent can be sent to you electronically.  As this is a virtual visit, video technology does not allow for your provider to perform a traditional examination. This may limit your provider's ability to fully assess your condition. If your provider identifies any concerns that need to be evaluated in person or the need to arrange testing (such as labs, EKG, etc.), we will make arrangements to do so. Although advances in technology are sophisticated, we cannot ensure that it will always work on either your end or our end. If the connection with a video visit is poor, the visit may have to be switched to a telephone visit. With either a video or telephone visit, we are not always able to ensure that we have a secure connection.  By engaging in this virtual visit, you consent to the provision of healthcare and authorize for your insurance to be billed (if applicable) for the services provided during this visit. Depending on your insurance coverage, you may receive a charge related to this service.  I need to obtain your verbal consent now. Are you willing to proceed with your visit today? [name] has provided verbal consent on 06/27/2023 for a virtual visit (video or telephone). [name], FNP  Date: 06/27/2023 8:41 AM  Virtual Visit via Video Note   I, [name], connected with  Daniel Harding  ([id number], July 16, 1956) on 06/27/23 at  8:45 AM EDT by a video-enabled telemedicine application and verified that I am speaking with the correct person using two identifiers.  Location: Patient: Virtual Visit Location Patient:  Home Provider: Virtual Visit Location Provider: Home Office   I discussed the limitations of evaluation and management by telemedicine and the availability of in person appointments. The patient expressed understanding and agreed to proceed.    History of Present Illness: Daniel Harding is a 67 y.o. who identifies as a male who was assigned male at birth, and is being seen today for back pain.   Symptom onset yesterday afternoon  He does walk a lot at work on uneven ground, steps in holes often- cannot remember a specific injury   Pain is radiating down his left leg from hip to foot  No numbness or weakness at this point, having a difficult time ambulating due to pain   Did have a similar episode in 2016 and had an MRI at that time with disc bulging throughout lumbar spine specific to left side   He also of note had COVID one week ago and is recovering   Problems:  Patient Active Problem List   Diagnosis Date Noted   Infrarenal abdominal aortic aneurysm (AAA) without rupture (HCC) 12/06/2022   Bilateral impacted cerumen 08/15/2019   Sensorineural hearing loss (SNHL), bilateral 08/15/2019   Routine general medical examination at a health care facility 07/23/2018   Family history of prostate cancer in father 07/23/2018   Family history of colonic polyps 07/23/2018   Hearing loss associated with syndrome of both ears 08/15/2016   Noise-induced hearing loss of both ears 06/06/2016   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/2016  Tinnitus of both ears 06/06/2016   Essential hypertension 09/16/2013    Allergies: No Known Allergies Medications:  Current Outpatient Medications:    aspirin 81 MG tablet, Take 81 mg by mouth daily., Disp: , Rfl:    atorvastatin (LIPITOR) 20 MG tablet, Take 1 tablet (20 mg total) by mouth daily., Disp: 90 tablet, Rfl: 1   lisinopril-hydrochlorothiazide (ZESTORETIC) 10-12.5 MG tablet, TAKE 1 TABLET BY MOUTH ONCE A DAY, Disp: 90 tablet, Rfl: 3   multivitamin  (THERAGRAN) per tablet, Take 1 tablet by mouth daily., Disp: , Rfl:   Observations/Objective: Patient is well-developed, well-nourished in no acute distress.  Resting comfortably  at home.  Head is normocephalic, atraumatic.  No labored breathing.  Speech is clear and coherent with logical content.  Patient is alert and oriented at baseline.    Assessment and Plan: 1. Lumbar back pain  - cyclobenzaprine (FLEXERIL) 10 MG tablet; Take 1 tablet (10 mg total) by mouth 3 (three) times daily as needed for muscle spasms.  Dispense: 30 tablet; Refill: 0 - methylPREDNISolone (MEDROL DOSEPAK) 4 MG TBPK tablet; 24 mg PO on day 1, then decr. by 4 mg/day x5 days (6 tablets on day one then decrease by 1 tablets daily until finished) take with food  Dispense: 21 tablet; Refill: 0     Follow Up Instructions: I discussed the assessment and treatment plan with the patient. The patient was provided an opportunity to ask questions and all were answered. The patient agreed with the plan and demonstrated an understanding of the instructions.  A copy of instructions were sent to the patient via MyChart unless otherwise noted below.    The patient was advised to call back or seek an in-person evaluation if the symptoms worsen or if the condition fails to improve as anticipated.  Time:  I spent 15 minutes with the patient via telehealth technology discussing the above problems/concerns.    [name], FNP

## 2023-06-28 ENCOUNTER — Emergency Department: Payer: Commercial Managed Care - PPO

## 2023-06-28 ENCOUNTER — Emergency Department
Admission: EM | Admit: 2023-06-28 | Discharge: 2023-06-28 | Disposition: A | Discharge status: Home / Self Care | Payer: Commercial Managed Care - PPO | Attending: Emergency Medicine | Admitting: Emergency Medicine

## 2023-06-28 ENCOUNTER — Other Ambulatory Visit: Payer: Self-pay

## 2023-06-28 DIAGNOSIS — S39012A Strain of muscle, fascia and tendon of lower back, initial encounter: Secondary | ICD-10-CM | POA: Diagnosis not present

## 2023-06-28 DIAGNOSIS — M545 Low back pain, unspecified: Secondary | ICD-10-CM | POA: Diagnosis not present

## 2023-06-28 DIAGNOSIS — M5442 Lumbago with sciatica, left side: Secondary | ICD-10-CM | POA: Diagnosis not present

## 2023-06-28 DIAGNOSIS — M48061 Spinal stenosis, lumbar region without neurogenic claudication: Secondary | ICD-10-CM | POA: Diagnosis not present

## 2023-06-28 DIAGNOSIS — I10 Essential (primary) hypertension: Secondary | ICD-10-CM | POA: Diagnosis not present

## 2023-06-28 DIAGNOSIS — M5127 Other intervertebral disc displacement, lumbosacral region: Secondary | ICD-10-CM | POA: Diagnosis not present

## 2023-06-28 DIAGNOSIS — M79605 Pain in left leg: Secondary | ICD-10-CM | POA: Diagnosis not present

## 2023-06-28 DIAGNOSIS — M549 Dorsalgia, unspecified: Secondary | ICD-10-CM | POA: Diagnosis not present

## 2023-06-28 DIAGNOSIS — M5432 Sciatica, left side: Secondary | ICD-10-CM

## 2023-06-28 DIAGNOSIS — M5136 Other intervertebral disc degeneration, lumbar region: Secondary | ICD-10-CM | POA: Diagnosis not present

## 2023-06-28 LAB — CBC
HCT: 42 % (ref 39.0–52.0)
Hemoglobin: 14.2 g/dL (ref 13.0–17.0)
MCH: 30.2 pg (ref 26.0–34.0)
MCHC: 33.8 g/dL (ref 30.0–36.0)
MCV: 89.4 fL (ref 80.0–100.0)
Platelets: 279 10*3/uL (ref 150–400)
RBC: 4.7 MIL/uL (ref 4.22–5.81)
RDW: 11.3 % — ABNORMAL LOW (ref 11.5–15.5)
WBC: 11.1 10*3/uL — ABNORMAL HIGH (ref 4.0–10.5)
nRBC: 0 % (ref 0.0–0.2)

## 2023-06-28 LAB — BASIC METABOLIC PANEL
Anion gap: 11 (ref 5–15)
BUN: 23 mg/dL (ref 8–23)
CO2: 21 mmol/L — ABNORMAL LOW (ref 22–32)
Calcium: 8.6 mg/dL — ABNORMAL LOW (ref 8.9–10.3)
Chloride: 105 mmol/L (ref 98–111)
Creatinine, Ser: 0.96 mg/dL (ref 0.61–1.24)
GFR, Estimated: >60 mL/min (ref >60)
Glucose, Bld: 114 mg/dL — ABNORMAL HIGH (ref 70–99)
Potassium: 3.8 mmol/L (ref 3.5–5.1)
Sodium: 137 mmol/L (ref 135–145)

## 2023-06-28 MED ORDER — DOCUSATE SODIUM 100 MG PO CAPS: 100.0000 mg | ORAL_CAPSULE | Freq: Every day | ORAL | 0 refills | Status: AC

## 2023-06-28 MED ORDER — MORPHINE SULFATE (PF) 4 MG/ML IV SOLN
4.0000 mg | Freq: Once | INTRAVENOUS | Status: DC
  Filled 2023-06-28: qty 1

## 2023-06-28 MED ORDER — KETOROLAC TROMETHAMINE 30 MG/ML IJ SOLN
30.0000 mg | Freq: Once | INTRAMUSCULAR | Status: AC
  Administered 2023-06-28: 30 mg via INTRAVENOUS
  Filled 2023-06-28: qty 1

## 2023-06-28 MED ORDER — OXYCODONE-ACETAMINOPHEN 5-325 MG PO TABS
1.0000 | ORAL_TABLET | Freq: Once | ORAL | Status: AC
  Administered 2023-06-28: 1 via ORAL
  Filled 2023-06-28: qty 1

## 2023-06-28 MED ORDER — MORPHINE SULFATE (PF) 4 MG/ML IV SOLN
6.0000 mg | Freq: Once | INTRAVENOUS | Status: AC
  Administered 2023-06-28: 6 mg via INTRAVENOUS
  Filled 2023-06-28: qty 2

## 2023-06-28 MED ORDER — TRAMADOL HCL 50 MG PO TABS
50.0000 mg | ORAL_TABLET | Freq: Four times a day (QID) | ORAL | 0 refills | Status: DC | PRN
Start: 2023-06-28 — End: 2023-06-28

## 2023-06-28 MED ORDER — MORPHINE SULFATE (PF) 4 MG/ML IV SOLN
4.0000 mg | Freq: Once | INTRAVENOUS | Status: AC
  Administered 2023-06-28: 4 mg via INTRAVENOUS

## 2023-06-28 MED ORDER — MORPHINE SULFATE (PF) 4 MG/ML IV SOLN
4.0000 mg | Freq: Once | INTRAVENOUS | Status: AC
  Administered 2023-06-28: 4 mg via INTRAVENOUS
  Filled 2023-06-28: qty 1

## 2023-06-28 MED ORDER — OXYCODONE HCL 5 MG PO TABS: 5.0000 mg | ORAL_TABLET | ORAL | 0 refills | Status: AC | PRN

## 2023-06-28 MED ORDER — DEXAMETHASONE SODIUM PHOSPHATE 10 MG/ML IJ SOLN
8.0000 mg | Freq: Once | INTRAMUSCULAR | Status: AC
  Administered 2023-06-28: 8 mg via INTRAVENOUS
  Filled 2023-06-28: qty 1

## 2023-06-28 MED ORDER — OXYCODONE HCL 5 MG PO TABS
5.0000 mg | ORAL_TABLET | ORAL | 0 refills | Status: DC | PRN
Start: 2023-06-28 — End: 2023-06-28

## 2023-06-28 MED ORDER — ONDANSETRON HCL 4 MG/2ML IJ SOLN
4.0000 mg | Freq: Once | INTRAMUSCULAR | Status: AC
  Administered 2023-06-28: 4 mg via INTRAVENOUS
  Filled 2023-06-28: qty 2

## 2023-06-28 NOTE — ED Provider Notes (Signed)
[facility] Provider Note    Event Date/Time   First MD Initiated Contact with Patient 06/28/23 1248     (approximate)   History   Back Pain   HPI  Daniel Harding is a 67 y.o. male who presents with complaints of low back pain with radiation down his left leg.  No loss of bowel or bladder control.  Reports symptoms been ongoing for about 2 to 3 days, has seen a provider via video, prescribed prednisone and Flexeril with little improvement but only started yesterday.  No fevers or chills.  No IV drug abuse.  History of bulging disc 8 years ago on MRI     Physical Exam   Triage Vital Signs: ED Triage Vitals  Encounter Vitals Group     BP 06/28/23 1234 (!) 192/81     Systolic BP Percentile --      Diastolic BP Percentile --      Pulse Rate 06/28/23 1234 65     Resp 06/28/23 1234 17     Temp 06/28/23 1234 97.6 F (36.4 C)     Temp Source 06/28/23 1234 Oral     SpO2 06/28/23 1234 97 %     Weight 06/28/23 1236 83.9 kg (185 lb)     Height 06/28/23 1236 1.803 m (5\' 11" )     Head Circumference --      Peak Flow --      Pain Score 06/28/23 1236 7     Pain Loc --      Pain Education --      Exclude from Growth Chart --     Most recent vital signs: Vitals:   06/28/23 1234  BP: (!) 192/81  Pulse: 65  Resp: 17  Temp: 97.6 F (36.4 C)  SpO2: 97%     General: Awake, no distress.  CV:  Good peripheral perfusion.  Resp:  Normal effort.  Abd:  No distention.  Other:  Lower extremities warm and well-perfused, normal strength in the lower extremities, painful left leg raise   ED Results / Procedures / Treatments   Labs (all labs ordered are listed, but only abnormal results are displayed) Labs Reviewed  CBC  BASIC METABOLIC PANEL     EKG     RADIOLOGY     PROCEDURES:  Critical Care performed:   Procedures   MEDICATIONS ORDERED IN ED: Medications  morphine (PF) 4 MG/ML injection 4 mg (has no administration in time range)   morphine (PF) 4 MG/ML injection 4 mg (4 mg Intravenous Given 06/28/23 1331)  ondansetron (ZOFRAN) injection 4 mg (4 mg Intravenous Given 06/28/23 1329)  ketorolac (TORADOL) 30 MG/ML injection 30 mg (30 mg Intravenous Given 06/28/23 1333)  dexamethasone (DECADRON) injection 8 mg (8 mg Intravenous Given 06/28/23 1335)     IMPRESSION / MDM / ASSESSMENT AND PLAN / ED COURSE  I reviewed the triage vital signs and the nursing notes. Patient's presentation is most consistent with acute presentation with potential threat to life or bodily function.  Patient presents with severe back pain with radiation down the left leg.  Differential includes slipped disc, sciatica, muscle strain  Leg strength appears normal, no loss of sensation, no saddle anesthesia however he does have significant pain with any movement.  This is limiting his ability to walk  Will treat with IV morphine, IV Zofran, IV Toradol and IV Decadron and reevaluate,  ----------------------------------------- 2:19 PM on 06/28/2023 ----------------------------------------- Attempted to ambulate the patient as his pain  seemed improved after initial treatment, however upon attempting to get out of bed developed severe low back pain again.  Was able to stand supported briefly but pain became too severe  Give additional dose of IV morphine, will send for MRI given history of bulging disc, severe pain, sciatica   I have asked my colleague to follow-up on MRI results      FINAL CLINICAL IMPRESSION(S) / ED DIAGNOSES   Final diagnoses:  Sciatica of left side     Rx / DC Orders   ED Discharge Orders     None        Note:  This document was prepared using Dragon voice recognition software and may include unintentional dictation errors.   Jene Every, MD 06/28/23 1420

## 2023-06-28 NOTE — ED Provider Notes (Signed)
Care assumed of patient from outgoing provider.  See their note for initial history, exam and plan.  Clinical Course as of 06/28/23 1610  Wed Jun 28, 2023  1458 3 days of back pain, severe, no neuro deficits or concern for cauda equina.  Pain medications given. MR pending.  [SM]    Clinical Course User Index [SM] Corena Herter, MD  MRI with no findings of cauda equina or epidural compression syndrome.  Pain has improved.  Discussed NSAIDs, Tylenol, given a short course of opioid pain medication given his severe pain.  Patient has a follow-up appointment tomorrow with his spine surgeon.  Given return precautions for any worsening symptoms.  Patient has Flexeril at home.  Discussed Lidoderm patches.  Given return precautions.   Corena Herter, MD 06/28/23 [phone]

## 2023-06-28 NOTE — Discharge Instructions (Addendum)
You were seen in the emergency department for lower back pain.  You had an MRI that did not show any signs of a spinal cord injury.  You do have significant spinal stenosis and degenerative disc disease.  You can use over-the-counter 4% Lidoderm patches on your lower back.  You can use ibuprofen and Tylenol for pain control.  You are given a prescription for narcotic pain medication to take if your symptoms have not improved after NSAIDs and Tylenol.  Pain control:  Ibuprofen (motrin/aleve/advil) - You can take 3 tablets (600 mg) every 6 hours as needed for pain/fever.  Acetaminophen (tylenol) - You can take 2 extra strength tablets (1000 mg) every 6 hours as needed for pain/fever.  You can alternate these medications or take them together.  Make sure you eat food/drink water when taking these medications.  You were given a prescription for narcotic pain medications.  Take only if in severe pain.  These are very addictive medications.  These medications can make you constipated.  If you need to take more than 1-2 doses, start a stool softner.  If you become constipated, take 1 capfull of MiraLAX, can repeat untill having regular bowel movements.  Keep this medication out of reach of any children.

## 2023-06-28 NOTE — ED Notes (Signed)
ERP informed pt is requesting pain medication. No new orders at this time

## 2023-06-28 NOTE — Telephone Encounter (Signed)
Patient informed of the message and voiced understanding. Patient inquired if anything can be prescribed to help alleviate his lumbar pain?

## 2023-06-28 NOTE — ED Triage Notes (Signed)
Pt presents to ED with c/o of L leg, pt denies any new injury or trauma to this area. Pt denies bowel or bladder control. Pt states HX of bulging disks.

## 2023-06-28 NOTE — ED Notes (Signed)
Called pt to let him know that prescription has been resent

## 2023-06-28 NOTE — ED Notes (Signed)
See triage notes. Patient stated he has lower back pain that goes down through his left leg.

## 2023-06-28 NOTE — ED Notes (Signed)
Pt called and reports that [facility] did not receive his prescription for oxycodone which was sent as part of the discharge.  EDP advises that she did sent this medication.  Called [facility] and they state that they did not receive this prescription.  Dr. Arnoldo Morale notified.

## 2023-06-29 NOTE — Telephone Encounter (Signed)
Patient informed rx was sent 

## 2023-07-06 DIAGNOSIS — M4726 Other spondylosis with radiculopathy, lumbar region: Secondary | ICD-10-CM | POA: Diagnosis not present

## 2023-07-06 DIAGNOSIS — M5136 Other intervertebral disc degeneration, lumbar region: Secondary | ICD-10-CM | POA: Diagnosis not present

## 2023-07-06 DIAGNOSIS — M5416 Radiculopathy, lumbar region: Secondary | ICD-10-CM | POA: Diagnosis not present

## 2023-07-06 DIAGNOSIS — M47816 Spondylosis without myelopathy or radiculopathy, lumbar region: Secondary | ICD-10-CM | POA: Diagnosis not present

## 2023-07-06 DIAGNOSIS — M5126 Other intervertebral disc displacement, lumbar region: Secondary | ICD-10-CM | POA: Diagnosis not present

## 2023-07-06 DIAGNOSIS — M5116 Intervertebral disc disorders with radiculopathy, lumbar region: Secondary | ICD-10-CM | POA: Diagnosis not present

## 2023-07-11 DIAGNOSIS — M5126 Other intervertebral disc displacement, lumbar region: Secondary | ICD-10-CM | POA: Diagnosis not present

## 2023-07-11 DIAGNOSIS — M5136 Other intervertebral disc degeneration, lumbar region: Secondary | ICD-10-CM | POA: Diagnosis not present

## 2023-07-11 DIAGNOSIS — M5416 Radiculopathy, lumbar region: Secondary | ICD-10-CM | POA: Diagnosis not present

## 2023-08-11 DIAGNOSIS — M51369 Other intervertebral disc degeneration, lumbar region without mention of lumbar back pain or lower extremity pain: Secondary | ICD-10-CM | POA: Diagnosis not present

## 2023-08-11 DIAGNOSIS — M5416 Radiculopathy, lumbar region: Secondary | ICD-10-CM | POA: Diagnosis not present

## 2023-08-11 DIAGNOSIS — M5126 Other intervertebral disc displacement, lumbar region: Secondary | ICD-10-CM | POA: Diagnosis not present

## 2023-08-11 DIAGNOSIS — M47816 Spondylosis without myelopathy or radiculopathy, lumbar region: Secondary | ICD-10-CM | POA: Diagnosis not present

## 2023-09-19 ENCOUNTER — Encounter: Payer: Self-pay | Admitting: Family Medicine

## 2023-09-22 DIAGNOSIS — M5442 Lumbago with sciatica, left side: Secondary | ICD-10-CM | POA: Diagnosis not present

## 2023-09-22 DIAGNOSIS — G8929 Other chronic pain: Secondary | ICD-10-CM | POA: Diagnosis not present

## 2023-09-25 ENCOUNTER — Encounter: Payer: Self-pay | Admitting: Family Medicine

## 2023-09-25 ENCOUNTER — Ambulatory Visit: Payer: Commercial Managed Care - PPO | Admitting: Family Medicine

## 2023-09-25 VITALS — BP 130/70 | HR 77 | Temp 97.6°F | Ht 71.0 in | Wt 194.4 lb

## 2023-09-25 DIAGNOSIS — I10 Essential (primary) hypertension: Secondary | ICD-10-CM | POA: Diagnosis not present

## 2023-09-25 DIAGNOSIS — R21 Rash and other nonspecific skin eruption: Secondary | ICD-10-CM | POA: Diagnosis not present

## 2023-09-25 DIAGNOSIS — E785 Hyperlipidemia, unspecified: Secondary | ICD-10-CM | POA: Diagnosis not present

## 2023-09-25 LAB — LIPID PANEL
Cholesterol: 151 mg/dL (ref 0–200)
HDL: 46.2 mg/dL (ref >39.00)
LDL Cholesterol: 77 mg/dL (ref 0–99)
NonHDL: 104.63
Total CHOL/HDL Ratio: 3
Triglycerides: 140 mg/dL (ref 0.0–149.0)
VLDL: 28 mg/dL (ref 0.0–40.0)

## 2023-09-25 LAB — HEPATIC FUNCTION PANEL
ALT: 21 U/L (ref 0–53)
AST: 20 U/L (ref 0–37)
Albumin: 4.3 g/dL (ref 3.5–5.2)
Alkaline Phosphatase: 84 U/L (ref 39–117)
Bilirubin, Direct: 0.1 mg/dL (ref 0.0–0.3)
Total Bilirubin: 0.4 mg/dL (ref 0.2–1.2)
Total Protein: 6.5 g/dL (ref 6.0–8.3)

## 2023-09-25 MED ORDER — FLUCONAZOLE 100 MG PO TABS: 100.0000 mg | ORAL_TABLET | Freq: Every day | ORAL | 0 refills | Status: DC

## 2023-09-25 MED ORDER — CICLOPIROX OLAMINE 0.77 % EX CREA: TOPICAL_CREAM | Freq: Two times a day (BID) | CUTANEOUS | 1 refills | Status: DC

## 2023-09-25 NOTE — Progress Notes (Signed)
Established Patient Office Visit  Subjective   Patient ID: Daniel Harding, male    DOB: 1956-08-30  Age: 67 y.o. MRN: [id number]  Chief Complaint  Patient presents with   Rash    Patient complains of rash,     HPI   Daniel Harding is seen for the following items  Persistent rash bilaterally groin region for about a couple months.  He tried several over-the-counter antifungal products.  He recalls using Lotrimin cream and also used some type of antifungal powder without any real improvement.  He has significant pruritus.  No history of diabetes.  No recent prednisone use.  He has had fairly eventful past few months.  He had COVID for the first time several weeks ago and was fairly sick from that but recovering.  He also had surgery on his back early October and is recovering from that.  This was secondary to herniated disc.  Hyperlipidemia and we had actually started atorvastatin last February with intention to get follow-up lipids and hepatic in a couple months.  He is currently taking the atorvastatin every other day.  He had some mild side effects with taking this daily.  He has hypertension treated with lisinopril HCTZ.  Compliant with therapy.  Blood pressure stable.  No recent dizziness.  Past Medical History:  Diagnosis Date   Allergy    Dog and cat dander   Heart murmur    as a child   Hypertension    Past Surgical History:  Procedure Laterality Date   COLONOSCOPY  2008   MS   KNEE ARTHROSCOPY WITH ANTERIOR CRUCIATE LIGAMENT (ACL) REPAIR Left 2006   septal deviation      reports that he quit smoking about 42 years ago. His smoking use included cigarettes. He started smoking about 52 years ago. He has a 2.5 pack-year smoking history. He has never used smokeless tobacco. He reports current alcohol use of about 1.0 standard drink of alcohol per week. He reports that he does not use drugs. family history includes Cancer (age of onset: 73) in his father; Hypertension in his  father. No Known Allergies  Review of Systems  Constitutional:  Negative for chills and fever.  Respiratory:  Negative for shortness of breath.   Cardiovascular:  Negative for chest pain.  Skin:  Positive for rash.      Objective:     BP 130/70 (BP Location: Left Arm, Patient Position: Sitting, Cuff Size: Normal)   Pulse 77   Temp 97.6 F (36.4 C) (Oral)   Ht 5\' 11"  (1.803 m)   Wt 194 lb 6.4 oz (88.2 kg)   SpO2 98%   BMI 27.11 kg/m  BP Readings from Last 3 Encounters:  09/25/23 130/70  06/28/23 131/83  12/06/22 138/78   Wt Readings from Last 3 Encounters:  09/25/23 194 lb 6.4 oz (88.2 kg)  06/28/23 185 lb (83.9 kg)  12/06/22 195 lb 4.8 oz (88.6 kg)      Physical Exam Vitals reviewed.  Constitutional:      General: He is not in acute distress. Cardiovascular:     Rate and Rhythm: Normal rate and regular rhythm.  Pulmonary:     Effort: Pulmonary effort is normal.     Breath sounds: Normal breath sounds.  Skin:    Findings: Rash present.     Comments: He has erythematous well-demarcated rash in groin region bilaterally.  This does not fluoresce with Woods lamp.  Neurological:     Mental Status: He is  alert.      No results found for any visits on 09/25/23.  Last CBC Lab Results  Component Value Date   WBC 11.1 (H) 06/28/2023   HGB 14.2 06/28/2023   HCT 42.0 06/28/2023   MCV 89.4 06/28/2023   MCH 30.2 06/28/2023   RDW 11.3 (L) 06/28/2023   PLT 279 06/28/2023   Last metabolic panel Lab Results  Component Value Date   GLUCOSE 114 (H) 06/28/2023   NA 137 06/28/2023   K 3.8 06/28/2023   CL 105 06/28/2023   CO2 21 (L) 06/28/2023   BUN 23 06/28/2023   CREATININE 0.96 06/28/2023   GFRNONAA >60 06/28/2023   CALCIUM 8.6 (L) 06/28/2023   PROT 7.1 12/06/2022   ALBUMIN 4.6 12/06/2022   BILITOT 0.5 12/06/2022   ALKPHOS 79 12/06/2022   AST 26 12/06/2022   ALT 22 12/06/2022   ANIONGAP 11 06/28/2023   Last lipids Lab Results  Component Value Date    CHOL 223 (H) 12/06/2022   HDL 54.20 12/06/2022   LDLCALC 145 (H) 12/06/2022   LDLDIRECT 172.5 08/13/2013   TRIG 119.0 12/06/2022   CHOLHDL 4 12/06/2022      The 16-XWRU ASCVD risk score (Arnett DK, et al., 2019) is: 19.1%    Assessment & Plan:   #1 bilateral groin rash.  Suspect fungal.  Does not fluoresce with Woods lamp to suggest corynebacterium infection.    he has tried anticandida treatments such as Lotrimin without success. ?  Trichophyton species -Keep area dry as possible -Try switch to Loprox cream topically twice daily -Consider daily fluconazole 100 mg/day for 7 days -Touch base if rash not clearing significantly couple of weeks  #2 hyperlipidemia treated with atorvastatin currently taking every other day.  Recheck lipid and hepatic panel.  #3 hypertension stable on lisinopril HCTZ.  Continue close monitoring   No follow-ups on file.    Evelena Peat, MD

## 2023-09-27 DIAGNOSIS — M5442 Lumbago with sciatica, left side: Secondary | ICD-10-CM | POA: Diagnosis not present

## 2023-09-27 DIAGNOSIS — G8929 Other chronic pain: Secondary | ICD-10-CM | POA: Diagnosis not present

## 2023-10-02 DIAGNOSIS — M5442 Lumbago with sciatica, left side: Secondary | ICD-10-CM | POA: Diagnosis not present

## 2023-10-02 DIAGNOSIS — G8929 Other chronic pain: Secondary | ICD-10-CM | POA: Diagnosis not present

## 2023-10-05 DIAGNOSIS — M5442 Lumbago with sciatica, left side: Secondary | ICD-10-CM | POA: Diagnosis not present

## 2023-10-05 DIAGNOSIS — G8929 Other chronic pain: Secondary | ICD-10-CM | POA: Diagnosis not present

## 2023-10-09 DIAGNOSIS — M5442 Lumbago with sciatica, left side: Secondary | ICD-10-CM | POA: Diagnosis not present

## 2023-10-09 DIAGNOSIS — G8929 Other chronic pain: Secondary | ICD-10-CM | POA: Diagnosis not present

## 2023-10-12 MED ORDER — CICLOPIROX OLAMINE 0.77 % EX CREA: TOPICAL_CREAM | Freq: Two times a day (BID) | CUTANEOUS | 0 refills | Status: DC | PRN

## 2023-10-13 DIAGNOSIS — G8929 Other chronic pain: Secondary | ICD-10-CM | POA: Diagnosis not present

## 2023-10-13 DIAGNOSIS — M5442 Lumbago with sciatica, left side: Secondary | ICD-10-CM | POA: Diagnosis not present

## 2023-10-16 DIAGNOSIS — M5442 Lumbago with sciatica, left side: Secondary | ICD-10-CM | POA: Diagnosis not present

## 2023-10-16 DIAGNOSIS — G8929 Other chronic pain: Secondary | ICD-10-CM | POA: Diagnosis not present

## 2023-10-19 DIAGNOSIS — G8929 Other chronic pain: Secondary | ICD-10-CM | POA: Diagnosis not present

## 2023-10-19 DIAGNOSIS — M5442 Lumbago with sciatica, left side: Secondary | ICD-10-CM | POA: Diagnosis not present

## 2023-10-23 DIAGNOSIS — G8929 Other chronic pain: Secondary | ICD-10-CM | POA: Diagnosis not present

## 2023-10-23 DIAGNOSIS — M5442 Lumbago with sciatica, left side: Secondary | ICD-10-CM | POA: Diagnosis not present

## 2023-10-31 DIAGNOSIS — G8929 Other chronic pain: Secondary | ICD-10-CM | POA: Diagnosis not present

## 2023-10-31 DIAGNOSIS — M5442 Lumbago with sciatica, left side: Secondary | ICD-10-CM | POA: Diagnosis not present

## 2023-11-23 DIAGNOSIS — Z9889 Other specified postprocedural states: Secondary | ICD-10-CM | POA: Diagnosis not present

## 2023-12-08 ENCOUNTER — Ambulatory Visit (INDEPENDENT_AMBULATORY_CARE_PROVIDER_SITE_OTHER): Payer: Commercial Managed Care - PPO | Admitting: Family Medicine

## 2023-12-08 ENCOUNTER — Encounter: Payer: Self-pay | Admitting: Family Medicine

## 2023-12-08 VITALS — BP 120/72 | HR 73 | Ht 72.05 in | Wt 196.7 lb

## 2023-12-08 DIAGNOSIS — Z125 Encounter for screening for malignant neoplasm of prostate: Secondary | ICD-10-CM | POA: Diagnosis not present

## 2023-12-08 DIAGNOSIS — Z23 Encounter for immunization: Secondary | ICD-10-CM

## 2023-12-08 DIAGNOSIS — Z0000 Encounter for general adult medical examination without abnormal findings: Secondary | ICD-10-CM | POA: Diagnosis not present

## 2023-12-08 DIAGNOSIS — E7800 Pure hypercholesterolemia, unspecified: Secondary | ICD-10-CM

## 2023-12-08 LAB — HEPATIC FUNCTION PANEL
ALT: 28 U/L (ref 0–53)
AST: 26 U/L (ref 0–37)
Albumin: 4.4 g/dL (ref 3.5–5.2)
Alkaline Phosphatase: 79 U/L (ref 39–117)
Bilirubin, Direct: 0.1 mg/dL (ref 0.0–0.3)
Total Bilirubin: 0.4 mg/dL (ref 0.2–1.2)
Total Protein: 6.8 g/dL (ref 6.0–8.3)

## 2023-12-08 LAB — CBC WITH DIFFERENTIAL/PLATELET
Basophils Absolute: 0.1 10*3/uL (ref 0.0–0.1)
Basophils Relative: 0.9 % (ref 0.0–3.0)
Eosinophils Absolute: 0.2 10*3/uL (ref 0.0–0.7)
Eosinophils Relative: 3.5 % (ref 0.0–5.0)
HCT: 42.4 % (ref 39.0–52.0)
Hemoglobin: 14.3 g/dL (ref 13.0–17.0)
Lymphocytes Relative: 37.2 % (ref 12.0–46.0)
Lymphs Abs: 2.1 10*3/uL (ref 0.7–4.0)
MCHC: 33.8 g/dL (ref 30.0–36.0)
MCV: 92.6 fL (ref 78.0–100.0)
Monocytes Absolute: 0.5 10*3/uL (ref 0.1–1.0)
Monocytes Relative: 8.6 % (ref 3.0–12.0)
Neutro Abs: 2.8 10*3/uL (ref 1.4–7.7)
Neutrophils Relative %: 49.8 % (ref 43.0–77.0)
Platelets: 222 10*3/uL (ref 150.0–400.0)
RBC: 4.57 Mil/uL (ref 4.22–5.81)
RDW: 12.2 % (ref 11.5–15.5)
WBC: 5.6 10*3/uL (ref 4.0–10.5)

## 2023-12-08 LAB — PSA, MEDICARE: PSA: 0.62 ng/mL (ref 0.10–4.00)

## 2023-12-08 LAB — BASIC METABOLIC PANEL
BUN: 15 mg/dL (ref 6–23)
CO2: 29 meq/L (ref 19–32)
Calcium: 9.4 mg/dL (ref 8.4–10.5)
Chloride: 102 meq/L (ref 96–112)
Creatinine, Ser: 0.99 mg/dL (ref 0.40–1.50)
GFR: 77.95 mL/min (ref >60.00)
Glucose, Bld: 90 mg/dL (ref 70–99)
Potassium: 4.5 meq/L (ref 3.5–5.1)
Sodium: 142 meq/L (ref 135–145)

## 2023-12-08 LAB — LIPID PANEL
Cholesterol: 161 mg/dL (ref 0–200)
HDL: 54.4 mg/dL (ref >39.00)
LDL Cholesterol: 83 mg/dL (ref 0–99)
NonHDL: 107.05
Total CHOL/HDL Ratio: 3
Triglycerides: 118 mg/dL (ref 0.0–149.0)
VLDL: 23.6 mg/dL (ref 0.0–40.0)

## 2023-12-08 NOTE — Progress Notes (Signed)
 Established Patient Office Visit  Subjective   Patient ID: Daniel Harding, male    DOB: 1956-03-15  Age: 68 y.o. MRN: [id number]  Chief Complaint  Patient presents with   Annual Exam    HPI   Daniel Harding is seen for physical exam.  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  Still having some difficulties with low back pain.  He has been diligent with doing exercises and walking since his surgery.  Still not back at work yet.  He has hypertension controlled with lisinopril  HCTZ.  Takes half tablet daily and home blood pressures have been well-controlled.  Taking atorvastatin  20 mg every other day and had lipids checked back in November which were significantly improved.  He is tolerating well.  Health maintenance reviewed  -Colonoscopy due 2029 -Flu vaccine already given -He received RSV vaccine this fall -Shingrix has been completed.  We had only date of the first 1. -Had Prevnar 20 but no other pneumonia vaccines -Tetanus due -Prior hepatitis C screen negative  Family history-mom is still alive age 69 but is mostly bedridden and in declining health.  Father died of multiple myeloma complications.  He has a sister alive and well.  No family history of early heart disease  Social history-married.  Out of work since October secondary to back surgery.  Wife still works with [employer] intensive care remote monitoring.  Patient has worked as an field seismologist.  He smoked for about 10 years back in the 27s until 1982.  No regular alcohol.  3 children from first marriage and 3 stepchildren.  He has 3 grandchildren and 1 on the way  Past Medical History:  Diagnosis Date   Allergy    Dog and cat dander   Heart murmur    as a child   Hypertension    Past Surgical History:  Procedure Laterality Date   COLONOSCOPY  2008   MS   KNEE ARTHROSCOPY WITH ANTERIOR CRUCIATE LIGAMENT (ACL) REPAIR Left 2006   septal deviation      reports that he quit smoking  about 43 years ago. His smoking use included cigarettes. He started smoking about 53 years ago. He has a 2.5 pack-year smoking history. He has never used smokeless tobacco. He reports current alcohol use of about 1.0 standard drink of alcohol per week. He reports that he does not use drugs. family history includes Cancer (age of onset: 38) in his father; Hypertension in his father. No Known Allergies   Review of Systems  Constitutional:  Negative for chills, fever, malaise/fatigue and weight loss.  HENT:  Negative for hearing loss.   Eyes:  Negative for blurred vision and double vision.  Respiratory:  Negative for cough and shortness of breath.   Cardiovascular:  Negative for chest pain, palpitations and leg swelling.  Gastrointestinal:  Negative for abdominal pain, blood in stool, constipation and diarrhea.  Genitourinary:  Negative for dysuria.  Skin:  Negative for rash.  Neurological:  Negative for dizziness, speech change, seizures, loss of consciousness and headaches.  Psychiatric/Behavioral:  Negative for depression.       Objective:     BP 120/72 (BP Location: Left Arm, Cuff Size: Normal)   Pulse 73   Ht 6' 0.05 (1.83 m)   Wt 196 lb 11.2 oz (89.2 kg)   SpO2 97%   BMI 26.64 kg/m  BP Readings from Last 3 Encounters:  12/08/23 120/72  09/25/23 130/70  06/28/23 131/83   Wt  Readings from Last 3 Encounters:  12/08/23 196 lb 11.2 oz (89.2 kg)  09/25/23 194 lb 6.4 oz (88.2 kg)  06/28/23 185 lb (83.9 kg)      Physical Exam Vitals reviewed.  Constitutional:      General: He is not in acute distress.    Appearance: He is well-developed. He is not ill-appearing.  HENT:     Head: Normocephalic and atraumatic.     Ears:     Comments: Has a small amount of cerumen right canal but nonobstructing Eyes:     Conjunctiva/sclera: Conjunctivae normal.     Pupils: Pupils are equal, round, and reactive to light.  Neck:     Thyroid : No thyromegaly.  Cardiovascular:     Rate and  Rhythm: Normal rate and regular rhythm.     Heart sounds: Normal heart sounds. No murmur heard. Pulmonary:     Effort: No respiratory distress.     Breath sounds: No wheezing or rales.  Abdominal:     General: Bowel sounds are normal. There is no distension.     Palpations: Abdomen is soft. There is no mass.     Tenderness: There is no abdominal tenderness. There is no guarding or rebound.  Musculoskeletal:     Cervical back: Normal range of motion and neck supple.     Right lower leg: No edema.     Left lower leg: No edema.  Lymphadenopathy:     Cervical: No cervical adenopathy.  Skin:    Findings: No rash.  Neurological:     Mental Status: He is alert and oriented to person, place, and time.     Cranial Nerves: No cranial nerve deficit.      No results found for any visits on 12/08/23.  Last CBC Lab Results  Component Value Date   WBC 11.1 (H) 06/28/2023   HGB 14.2 06/28/2023   HCT 42.0 06/28/2023   MCV 89.4 06/28/2023   MCH 30.2 06/28/2023   RDW 11.3 (L) 06/28/2023   PLT 279 06/28/2023   Last metabolic panel Lab Results  Component Value Date   GLUCOSE 114 (H) 06/28/2023   NA 137 06/28/2023   K 3.8 06/28/2023   CL 105 06/28/2023   CO2 21 (L) 06/28/2023   BUN 23 06/28/2023   CREATININE 0.96 06/28/2023   GFRNONAA >60 06/28/2023   CALCIUM  8.6 (L) 06/28/2023   PROT 6.5 09/25/2023   ALBUMIN 4.3 09/25/2023   BILITOT 0.4 09/25/2023   ALKPHOS 84 09/25/2023   AST 20 09/25/2023   ALT 21 09/25/2023   ANIONGAP 11 06/28/2023   Last lipids Lab Results  Component Value Date   CHOL 151 09/25/2023   HDL 46.20 09/25/2023   LDLCALC 77 09/25/2023   LDLDIRECT 172.5 08/13/2013   TRIG 140.0 09/25/2023   CHOLHDL 3 09/25/2023      The [id number] ASCVD risk score (Arnett DK, et al., 2019) is: 15.7%    Assessment & Plan:   Problem List Items Addressed This Visit   None Visit Diagnoses       Prostate cancer screening    -  Primary   Relevant Orders   PSA, Medicare      Physical exam       Relevant Orders   Basic metabolic panel   CBC with Differential/Platelet     68 year old male with history of hypertension which is controlled on lisinopril  HCTZ and hyperlipidemia treated with atorvastatin  low dosage.  He had back surgery back in October and still recovering  from that.  Health maintenance reviewed.  We discussed the following  -Recommend Prevnar 20 and patient consents -Consider tetanus booster at some point.  We explained that several Medicare advantage programs did not cover this in the absence of injury -Check labs as above.  We did not check lipid and hepatic since these were done in November and stable -Continue annual flu vaccine  No follow-ups on file.    Wolm Scarlet, MD

## 2023-12-08 NOTE — Addendum Note (Signed)
 Addended by: Aurelio Leer on: 12/08/2023 09:19 AM   Modules accepted: Orders

## 2024-01-24 ENCOUNTER — Other Ambulatory Visit: Payer: Self-pay | Admitting: Family Medicine

## 2024-01-24 ENCOUNTER — Other Ambulatory Visit (HOSPITAL_COMMUNITY): Payer: Self-pay

## 2024-01-24 MED ORDER — ATORVASTATIN CALCIUM 20 MG PO TABS
20.0000 mg | ORAL_TABLET | Freq: Every day | ORAL | 1 refills | Status: AC
  Filled 2024-01-24: qty 90, 90d supply, fill #0
  Filled 2024-07-17: qty 90, 90d supply, fill #1

## 2024-01-26 ENCOUNTER — Other Ambulatory Visit (HOSPITAL_COMMUNITY): Payer: Self-pay

## 2024-03-12 ENCOUNTER — Encounter: Payer: Self-pay | Admitting: Family Medicine

## 2024-03-12 MED ORDER — CICLOPIROX OLAMINE 0.77 % EX CREA: TOPICAL_CREAM | Freq: Two times a day (BID) | CUTANEOUS | 1 refills | Status: AC

## 2024-06-13 DIAGNOSIS — M5416 Radiculopathy, lumbar region: Secondary | ICD-10-CM | POA: Diagnosis not present

## 2024-06-13 DIAGNOSIS — Z9889 Other specified postprocedural states: Secondary | ICD-10-CM | POA: Diagnosis not present

## 2024-06-13 DIAGNOSIS — M47816 Spondylosis without myelopathy or radiculopathy, lumbar region: Secondary | ICD-10-CM | POA: Diagnosis not present

## 2024-06-13 DIAGNOSIS — M51362 Other intervertebral disc degeneration, lumbar region with discogenic back pain and lower extremity pain: Secondary | ICD-10-CM | POA: Diagnosis not present

## 2024-06-19 ENCOUNTER — Other Ambulatory Visit: Payer: Self-pay | Admitting: Physician Assistant

## 2024-06-19 ENCOUNTER — Encounter: Payer: Self-pay | Admitting: Physician Assistant

## 2024-06-19 DIAGNOSIS — M5416 Radiculopathy, lumbar region: Secondary | ICD-10-CM

## 2024-06-20 ENCOUNTER — Encounter: Payer: Self-pay | Admitting: Physician Assistant

## 2024-06-26 ENCOUNTER — Ambulatory Visit
Admission: RE | Admit: 2024-06-26 | Discharge: 2024-06-26 | Disposition: A | Discharge status: Home / Self Care | Source: Ambulatory Visit | Attending: Physician Assistant | Admitting: Physician Assistant

## 2024-06-26 DIAGNOSIS — M48061 Spinal stenosis, lumbar region without neurogenic claudication: Secondary | ICD-10-CM | POA: Diagnosis not present

## 2024-06-26 DIAGNOSIS — M5416 Radiculopathy, lumbar region: Secondary | ICD-10-CM

## 2024-06-26 DIAGNOSIS — M4726 Other spondylosis with radiculopathy, lumbar region: Secondary | ICD-10-CM | POA: Diagnosis not present

## 2024-06-26 DIAGNOSIS — M5116 Intervertebral disc disorders with radiculopathy, lumbar region: Secondary | ICD-10-CM | POA: Diagnosis not present

## 2024-06-26 MED ORDER — GADOPICLENOL 0.5 MMOL/ML IV SOLN
9.0000 mL | Freq: Once | INTRAVENOUS | Status: AC | PRN
  Administered 2024-06-26: 9 mL via INTRAVENOUS

## 2024-07-15 DIAGNOSIS — M51362 Other intervertebral disc degeneration, lumbar region with discogenic back pain and lower extremity pain: Secondary | ICD-10-CM | POA: Diagnosis not present

## 2024-07-15 DIAGNOSIS — Z9889 Other specified postprocedural states: Secondary | ICD-10-CM | POA: Diagnosis not present

## 2024-07-15 DIAGNOSIS — M47816 Spondylosis without myelopathy or radiculopathy, lumbar region: Secondary | ICD-10-CM | POA: Diagnosis not present

## 2024-07-15 DIAGNOSIS — M5126 Other intervertebral disc displacement, lumbar region: Secondary | ICD-10-CM | POA: Diagnosis not present

## 2024-07-15 DIAGNOSIS — M5416 Radiculopathy, lumbar region: Secondary | ICD-10-CM | POA: Diagnosis not present

## 2024-07-25 DIAGNOSIS — M21372 Foot drop, left foot: Secondary | ICD-10-CM | POA: Diagnosis not present

## 2024-07-25 DIAGNOSIS — M51362 Other intervertebral disc degeneration, lumbar region with discogenic back pain and lower extremity pain: Secondary | ICD-10-CM | POA: Diagnosis not present

## 2024-07-25 DIAGNOSIS — M47816 Spondylosis without myelopathy or radiculopathy, lumbar region: Secondary | ICD-10-CM | POA: Diagnosis not present

## 2024-07-25 DIAGNOSIS — M5126 Other intervertebral disc displacement, lumbar region: Secondary | ICD-10-CM | POA: Diagnosis not present

## 2024-07-25 DIAGNOSIS — Z9889 Other specified postprocedural states: Secondary | ICD-10-CM | POA: Diagnosis not present

## 2024-07-30 DIAGNOSIS — M4727 Other spondylosis with radiculopathy, lumbosacral region: Secondary | ICD-10-CM | POA: Diagnosis not present

## 2024-07-30 DIAGNOSIS — M5126 Other intervertebral disc displacement, lumbar region: Secondary | ICD-10-CM | POA: Diagnosis not present

## 2024-07-30 DIAGNOSIS — M5416 Radiculopathy, lumbar region: Secondary | ICD-10-CM | POA: Diagnosis not present

## 2024-07-30 DIAGNOSIS — M4726 Other spondylosis with radiculopathy, lumbar region: Secondary | ICD-10-CM | POA: Diagnosis not present

## 2024-07-30 DIAGNOSIS — M4316 Spondylolisthesis, lumbar region: Secondary | ICD-10-CM | POA: Diagnosis not present

## 2024-08-01 DIAGNOSIS — H6121 Impacted cerumen, right ear: Secondary | ICD-10-CM | POA: Diagnosis not present

## 2024-08-12 ENCOUNTER — Encounter: Payer: Self-pay | Admitting: Neurology

## 2024-08-27 DIAGNOSIS — M5416 Radiculopathy, lumbar region: Secondary | ICD-10-CM | POA: Diagnosis not present

## 2024-08-28 ENCOUNTER — Other Ambulatory Visit: Payer: Self-pay

## 2024-08-28 DIAGNOSIS — R202 Paresthesia of skin: Secondary | ICD-10-CM

## 2024-09-11 DIAGNOSIS — M9953 Intervertebral disc stenosis of neural canal of lumbar region: Secondary | ICD-10-CM | POA: Diagnosis not present

## 2024-09-11 DIAGNOSIS — M5126 Other intervertebral disc displacement, lumbar region: Secondary | ICD-10-CM | POA: Diagnosis not present

## 2024-09-11 DIAGNOSIS — Z9889 Other specified postprocedural states: Secondary | ICD-10-CM | POA: Diagnosis not present

## 2024-09-11 DIAGNOSIS — M51362 Other intervertebral disc degeneration, lumbar region with discogenic back pain and lower extremity pain: Secondary | ICD-10-CM | POA: Diagnosis not present

## 2024-09-11 DIAGNOSIS — M21372 Foot drop, left foot: Secondary | ICD-10-CM | POA: Diagnosis not present

## 2024-09-11 DIAGNOSIS — M5416 Radiculopathy, lumbar region: Secondary | ICD-10-CM | POA: Diagnosis not present

## 2024-09-11 DIAGNOSIS — M47816 Spondylosis without myelopathy or radiculopathy, lumbar region: Secondary | ICD-10-CM | POA: Diagnosis not present

## 2024-10-16 DIAGNOSIS — H903 Sensorineural hearing loss, bilateral: Secondary | ICD-10-CM | POA: Diagnosis not present

## 2024-10-21 ENCOUNTER — Encounter: Admitting: Neurology
